# Patient Record
Sex: Female | Born: 1970
Health system: Southern US, Community
[De-identification: ages and names within clinical notes are randomized; demographics above are authoritative.]

## PROBLEM LIST (undated history)

## (undated) DIAGNOSIS — I10 Essential (primary) hypertension: Secondary | ICD-10-CM

## (undated) DIAGNOSIS — D649 Anemia, unspecified: Secondary | ICD-10-CM

## (undated) HISTORY — PX: FRACTURE SURGERY: SHX138

## (undated) HISTORY — DX: Anemia, unspecified: D64.9

## (undated) HISTORY — DX: Essential (primary) hypertension: I10

---

## 2007-09-16 ENCOUNTER — Emergency Department (HOSPITAL_COMMUNITY): Admission: EM | Admit: 2007-09-16 | Discharge: 2007-09-16 | Payer: Self-pay | Admitting: Emergency Medicine

## 2007-09-20 ENCOUNTER — Ambulatory Visit (HOSPITAL_BASED_OUTPATIENT_CLINIC_OR_DEPARTMENT_OTHER): Admission: RE | Admit: 2007-09-20 | Discharge: 2007-09-21 | Payer: Self-pay | Admitting: Orthopedic Surgery

## 2009-10-10 ENCOUNTER — Ambulatory Visit: Payer: Self-pay | Admitting: Diagnostic Radiology

## 2009-11-16 ENCOUNTER — Emergency Department (HOSPITAL_BASED_OUTPATIENT_CLINIC_OR_DEPARTMENT_OTHER): Admission: EM | Admit: 2009-11-16 | Discharge: 2009-11-16 | Payer: Self-pay | Admitting: Emergency Medicine

## 2009-11-16 ENCOUNTER — Ambulatory Visit: Payer: Self-pay | Admitting: Diagnostic Radiology

## 2010-03-12 ENCOUNTER — Emergency Department (HOSPITAL_BASED_OUTPATIENT_CLINIC_OR_DEPARTMENT_OTHER): Admission: EM | Admit: 2010-03-12 | Discharge: 2009-10-10 | Payer: Self-pay | Admitting: Emergency Medicine

## 2010-06-19 LAB — BASIC METABOLIC PANEL
BUN: 9 mg/dL (ref 6–23)
Calcium: 9.6 mg/dL (ref 8.4–10.5)
Creatinine, Ser: 0.9 mg/dL (ref 0.4–1.2)
GFR calc Af Amer: >60 mL/min (ref >60)
GFR calc non Af Amer: >60 mL/min (ref >60)
Glucose, Bld: 82 mg/dL (ref 70–99)
Potassium: 3.7 mEq/L (ref 3.5–5.1)
Sodium: 141 mEq/L (ref 135–145)

## 2010-06-19 LAB — DIFFERENTIAL
Basophils Absolute: 0.2 10*3/uL — ABNORMAL HIGH (ref 0.0–0.1)
Basophils Relative: 2 % — ABNORMAL HIGH (ref 0–1)
Eosinophils Absolute: 0.3 10*3/uL (ref 0.0–0.7)
Lymphocytes Relative: 28 % (ref 12–46)
Monocytes Absolute: 0.7 10*3/uL (ref 0.1–1.0)
Neutrophils Relative %: 59 % (ref 43–77)

## 2010-06-19 LAB — CBC
HCT: 35.8 % — ABNORMAL LOW (ref 36.0–46.0)
MCHC: 33.4 g/dL (ref 30.0–36.0)
MCV: 92.5 fL (ref 78.0–100.0)
Platelets: 395 10*3/uL (ref 150–400)

## 2010-06-19 LAB — POCT CARDIAC MARKERS
Myoglobin, poc: 94.6 ng/mL (ref 12–200)
Troponin i, poc: <0.05 ng/mL (ref 0.00–0.09)

## 2010-06-19 LAB — D-DIMER, QUANTITATIVE: D-Dimer, Quant: <0.22 ug/mL-FEU (ref 0.00–0.48)

## 2010-08-18 NOTE — Op Note (Signed)
NAME:  Julie Bowers, Julie Bowers              ACCOUNT NO.:  [id number]   MEDICAL RECORD NO.:  [id number]          PATIENT TYPE:  AMB   LOCATION:  DSC                          FACILITY:  [facility]   PHYSICIAN:  Leonides Grills, M.D.     DATE OF BIRTH:  [birth year]   DATE OF PROCEDURE:  DATE OF DISCHARGE:  09/21/2007                               OPERATIVE REPORT   PREOPERATIVE DIAGNOSES:  1. Right intraarticular calcaneus fracture.  2. Right navicular fracture.   POSTOPERATIVE DIAGNOSES:  1. Right intraarticular calcaneus fracture.  2. Right navicular fracture.   OPERATION:  1. Open reduction and fixation, right calcaneus fracture.  2. Open reduction and fixation, right navicular fracture.  3. Stress x-rays, right foot.  4. Right sural nerve neurolysis.  5. Right partial excision of calcaneus.  6. Partial excision, right navicular.   ANESTHESIA:  General.   SURGEON:  Leonides Grills, M.D.   ASSISTANT:  Richardean Canal, PAC.   ESTIMATED BLOOD LOSS:  Minimal.   TOURNIQUET TIME:  An hour and a half.   COMPLICATIONS:  None.   DISPOSITION:  Stable to PR.   INDICATIONS:  This is a 40 year old female who sustained the above  injury.  She has consented to the above procedure.  All risks including  infection, vessel injury, nonunion, malunion, hardware irritation,  hardware failure, stiffness, arthritis, persistent pain, worsening pain,  and prolonged recovery were all explained, questions were encouraged and  answered.   OPERATION:  The patient was brought to the operating room and placed in  the supine position.  After adequate general endotracheal tube  anesthesia was administered as well as Ancef 1 g IV piggyback, a bump  placed in the right ipsilateral hip and the right lower extremity and  prepped and draped in sterile manner over proximally thigh tourniquet,  limbus gravity exsanguinated.  Tourniquet elevated to 90 mmHg.  A  longitudinal incision right post tibial tendon navicular region  was then  made.  Dissection was carried down through the skin.  Hemostasis was  obtained. __________  just superior to the posterior tibial tendon was  then developed into the fracture site itself.  There was a large amount  of comminution this area and multiple fragments were removed, i.e.,  partial excision of the navicular.  Once these fragments were removed,  this was placed on the back table, and we then were able to preserve a  piece that was medially based off the navicular to the post tibial  tendon.  We then advanced this into the main body of the navicular and  then affixed this with a two-point reduction clamp, this fit  beautifully.  We then provisionally fixed this with a K-wire and then  fixed it permanently with a 2.7-mm fully threaded cortical set screw  using a 2.0-mm hole respectively.  This had excellent purchase and  maintenance of the correction.  K-wire was removed.  Stress x-rays were  then obtained and 3 views of the foot showed no gross motion fixation,  proprioception, and excellent alignment as well.  The area was copiously  irrigated with normal saline.  Subcu was closed with 3-0 Vicryl.  Skin  was closed 4-0 nylon.  We then made a longitudinal incision over the  lateral aspect of the right calcaneal neck anterior process, calcaneal  cuboid joint region.  Dissection was carried down through skin.  Hemostasis was obtained.  A formal sural neurolysis was then performed.  This was then retracted out of harm's way.  The peroneus longus and  peroneus brevis tendons were then carefully dissected out isolating and  a tenolysis was performed, and these were then retracted out of harm's  way.  Once this was done, we then identified the fracture.  The anterior  process of the calcaneus was highly comminuted that was irreparable.  This was then resected with a rongeur and sharply with the scalpel,  i.e., partial excision calcaneus.  Once this was done, the area was   copiously irrigated with normal saline, and we entered to CC joint.  Again, this was a joint depression-type fracture involving the  calcaneus, and it was highly comminuted.  We then osteotomized just  proximal to this about a centimeter and wedged this open to anatomically  reduce the fracture.  Once this was held in the anatomic position, we  then chose a DePuy titanium mini frag locking plate and applied this to  the lateral wall of the calcaneus.  We then sequentially placed 2-0  locking screws through the plate, which were 4 distally and held the  fracture anatomically reduced.  When we arranged the foot it held it  reduced as well.  The three more proximal screws were filled with  nonlocking 2-0 screws.  Again, this held the fracture anatomically.  We  then obtained stress x-rays and 3 views that showed no gross motion  fixation, proprioception, and excellent alignment as well.  The areas  were copiously irrigated with normal saline.  Tourniquet was  desufflated.  Hemostasis was obtained.  Subcu was closed with 3-0  Vicryl.  Skin was closed 4-0 nylon.  Sterile dressing was applied.  Modified Jones dressing was applied with ankle and knee in dorsiflexion.  The patient was stable to the PR.      Leonides Grills, M.D.  Electronically Signed     PB/MEDQ  D:  09/20/2007  T:  09/21/2007  Job:  [id number]

## 2010-12-31 LAB — POCT HEMOGLOBIN-HEMACUE: Hemoglobin: 13.5

## 2011-10-16 ENCOUNTER — Emergency Department (HOSPITAL_BASED_OUTPATIENT_CLINIC_OR_DEPARTMENT_OTHER)
Admission: EM | Admit: 2011-10-16 | Discharge: 2011-10-16 | Disposition: A | Discharge status: Home / Self Care | Payer: BC Managed Care – PPO | Attending: Emergency Medicine | Admitting: Emergency Medicine

## 2011-10-16 ENCOUNTER — Encounter (HOSPITAL_BASED_OUTPATIENT_CLINIC_OR_DEPARTMENT_OTHER): Payer: Self-pay | Admitting: *Deleted

## 2011-10-16 DIAGNOSIS — M79671 Pain in right foot: Secondary | ICD-10-CM

## 2011-10-16 DIAGNOSIS — Z87828 Personal history of other (healed) physical injury and trauma: Secondary | ICD-10-CM | POA: Insufficient documentation

## 2011-10-16 DIAGNOSIS — F17200 Nicotine dependence, unspecified, uncomplicated: Secondary | ICD-10-CM | POA: Insufficient documentation

## 2011-10-16 DIAGNOSIS — M79609 Pain in unspecified limb: Secondary | ICD-10-CM | POA: Insufficient documentation

## 2011-10-16 DIAGNOSIS — L84 Corns and callosities: Secondary | ICD-10-CM | POA: Insufficient documentation

## 2011-10-16 MED ORDER — HYDROCODONE-ACETAMINOPHEN 5-325 MG PO TABS
2.0000 | ORAL_TABLET | Freq: Once | ORAL | Status: AC
  Administered 2011-10-16: 2 via ORAL
  Filled 2011-10-16: qty 2

## 2011-10-16 MED ORDER — HYDROCODONE-ACETAMINOPHEN 7.5-325 MG PO TABS: 1.0000 | ORAL_TABLET | ORAL | Status: AC | PRN

## 2011-10-16 MED ORDER — KETOROLAC TROMETHAMINE 10 MG PO TABS
10.0000 mg | ORAL_TABLET | Freq: Once | ORAL | Status: AC
  Administered 2011-10-16: 10 mg via ORAL
  Filled 2011-10-16: qty 1

## 2011-10-16 MED ORDER — PROMETHAZINE HCL 25 MG PO TABS
12.5000 mg | ORAL_TABLET | Freq: Once | ORAL | Status: AC
  Administered 2011-10-16: 12.5 mg via ORAL
  Filled 2011-10-16: qty 1

## 2011-10-16 MED ORDER — MELOXICAM 7.5 MG PO TABS: ORAL_TABLET | ORAL | Status: DC

## 2011-10-16 NOTE — ED Notes (Signed)
Pt states she has had right heel pain and pain to the balls of her feet for several months.

## 2011-10-16 NOTE — ED Notes (Signed)
Pt presents to ED today with pain to ball of foot and heel.  Pain has been ongoing for several months.  No obvious deformity or injury noted

## 2011-10-16 NOTE — ED Provider Notes (Signed)
Medical screening examination/treatment/procedure(s) were performed by non-physician practitioner and as supervising physician I was immediately available for consultation/collaboration.  Gerhard Munch, MD 10/16/11 2318

## 2011-10-16 NOTE — ED Provider Notes (Signed)
History     CSN: [id number]  Arrival date & time 10/16/11  2151   First MD Initiated Contact with Patient 10/16/11 2211      Chief Complaint  Patient presents with  . Foot Pain    (Consider location/radiation/quality/duration/timing/severity/associated sxs/prior treatment) Patient is a 41 y.o. female presenting with lower extremity pain. The history is provided by the patient.  Foot Pain This is a chronic problem. The current episode started more than 1 month ago. The problem occurs daily. The problem has been gradually worsening. Associated symptoms include arthralgias. Pertinent negatives include no abdominal pain, chest pain, coughing or neck pain. Nothing aggravates the symptoms. She has tried nothing for the symptoms. The treatment provided no relief.    History reviewed. No pertinent past medical history.  Past Surgical History  Procedure Date  . Fracture surgery     History reviewed. No pertinent family history.  History  Substance Use Topics  . Smoking status: Current Everyday Smoker  . Smokeless tobacco: Not on file  . Alcohol Use: Yes    OB History    Grav Para Term Preterm Abortions TAB SAB Ect Mult Living                  Review of Systems  Constitutional: Negative for activity change.       All ROS Neg except as noted in HPI  HENT: Negative for nosebleeds and neck pain.   Eyes: Negative for photophobia and discharge.  Respiratory: Negative for cough, shortness of breath and wheezing.   Cardiovascular: Negative for chest pain and palpitations.  Gastrointestinal: Negative for abdominal pain and blood in stool.  Genitourinary: Negative for dysuria, frequency and hematuria.  Musculoskeletal: Positive for arthralgias. Negative for back pain.  Skin: Negative.   Neurological: Negative for dizziness, seizures and speech difficulty.  Psychiatric/Behavioral: Negative for hallucinations and confusion.    Allergies  Review of patient's allergies indicates no  known allergies.  Home Medications   Current Outpatient Rx  Name Route Sig Dispense Refill  . BIOTIN PO Oral Take 1 tablet by mouth daily.    . IBUPROFEN 200 MG PO TABS Oral Take 600 mg by mouth every 6 (six) hours as needed. For pain    . ADULT MULTIVITAMIN W/MINERALS CH Oral Take 1 tablet by mouth daily.      BP 168/75  Pulse 86  Temp 97.7 F (36.5 C) (Oral)  Resp 20  Ht 5\' 9"  (1.753 m)  Wt 214 lb (97.07 kg)  BMI 31.60 kg/m2  SpO2 100%  LMP 09/25/2011  Physical Exam  Nursing note and vitals reviewed. Constitutional: She is oriented to person, place, and time. She appears well-developed and well-nourished.  Non-toxic appearance.  HENT:  Head: Normocephalic.  Right Ear: Tympanic membrane and external ear normal.  Left Ear: Tympanic membrane and external ear normal.  Eyes: EOM and lids are normal. Pupils are equal, round, and reactive to light.  Neck: Normal range of motion. Neck supple. Carotid bruit is not present.  Cardiovascular: Normal rate, regular rhythm, normal heart sounds, intact distal pulses and normal pulses.   Pulmonary/Chest: Breath sounds normal. No respiratory distress.  Abdominal: Soft. Bowel sounds are normal. There is no tenderness. There is no guarding.  Musculoskeletal: Normal range of motion.       There is increased callus formation and of the first third and fifth metatarsal heads on the right and the left. There is a tenderness to palpation and manipulation of the right heel area.  The Achilles tendon are intact bilaterally. There no lesions between the toes. Is full range of motion of the toes and the ankles. The dorsalis pedis and posterior tibial pulses are 2+ bilaterally.  Lymphadenopathy:       Head (right side): No submandibular adenopathy present.       Head (left side): No submandibular adenopathy present.    She has no cervical adenopathy.  Neurological: She is alert and oriented to person, place, and time. She has normal strength. No cranial  nerve deficit or sensory deficit.  Skin: Skin is warm and dry.  Psychiatric: She has a normal mood and affect. Her speech is normal.    ED Course  Procedures (including critical care time)  Labs Reviewed - No data to display No results found.   No diagnosis found.    MDM  I have reviewed nursing notes, vital signs, and all appropriate lab and imaging results for this patient. Patient sustained an injury to the right foot several years ago. She has since that time been developing more and more pain in the right foot and now in the left foot. 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 Her feet are giving her more and more problems.  Discussed plan with the patient. Plan at this time is for the patient to see podiatry for evaluation and management. Prescription for Mobic 7.5 mg and Norco 7.5 mg given to the patient.       Kathie Dike, [place] 10/16/11 2312

## 2016-12-08 ENCOUNTER — Encounter (HOSPITAL_COMMUNITY): Payer: Self-pay | Admitting: Emergency Medicine

## 2016-12-08 ENCOUNTER — Emergency Department (HOSPITAL_COMMUNITY)
Admission: EM | Admit: 2016-12-08 | Discharge: 2016-12-08 | Disposition: A | Discharge status: Home / Self Care | Payer: Self-pay | Attending: Emergency Medicine | Admitting: Emergency Medicine

## 2016-12-08 DIAGNOSIS — F17210 Nicotine dependence, cigarettes, uncomplicated: Secondary | ICD-10-CM | POA: Insufficient documentation

## 2016-12-08 DIAGNOSIS — M62830 Muscle spasm of back: Secondary | ICD-10-CM | POA: Insufficient documentation

## 2016-12-08 DIAGNOSIS — Z79899 Other long term (current) drug therapy: Secondary | ICD-10-CM | POA: Insufficient documentation

## 2016-12-08 MED ORDER — CYCLOBENZAPRINE HCL 10 MG PO TABS: 10.0000 mg | ORAL_TABLET | Freq: Two times a day (BID) | ORAL | 0 refills | Status: DC | PRN

## 2016-12-08 MED ORDER — LIDOCAINE 5 % EX PTCH: 1.0000 | MEDICATED_PATCH | CUTANEOUS | 0 refills | Status: DC

## 2016-12-08 MED ORDER — IBUPROFEN 800 MG PO TABS: 800.0000 mg | ORAL_TABLET | Freq: Three times a day (TID) | ORAL | 0 refills | Status: DC

## 2016-12-08 MED ORDER — KETOROLAC TROMETHAMINE 60 MG/2ML IM SOLN
30.0000 mg | Freq: Once | INTRAMUSCULAR | Status: AC
  Administered 2016-12-08: 30 mg via INTRAMUSCULAR
  Filled 2016-12-08: qty 2

## 2016-12-08 NOTE — Discharge Instructions (Signed)
Medications as prescribed.

## 2016-12-08 NOTE — ED Provider Notes (Signed)
Patient seen and evaluated. Discussed with resident, and evaluated with resident. Patient has reproducible pain right paraspinal musculature. Clear lungs. No GU or UTI symptoms. Tender to palpate. Pain is rotation and flexion. No extremity symptoms. No rash or vesicles to suggest zoster. One similar episode in the past responded well to Toradol, anti-inflammatories, and muscle relaxants. Given Toradol 30 IM. Anti-inflammatory muscle relaxer prescription. Work note.   Rolland PorterJames, Morgen Linebaugh, MD 12/08/16 [phone]

## 2016-12-08 NOTE — ED Provider Notes (Signed)
[facility] Provider Note   CSN: [id number] Arrival date & time: 12/08/16  [id number]  History   Chief Complaint Chief Complaint  Patient presents with  . Back Pain    HPI Julie Bowers is a 46 y.o. female.  Patient presented with acute onset severe pain in the middle of back, mainly on the right side of her mid-back. She states she was in her usual state of health until she went to sleep the night before and all of the sudden she woke up with pain in her back. The pain is rated as a 10/10 and described as a sharp stabbing pain. It does not radiate to her chest or leg. She has not had any loss of bladder bowel function, pain with urination, history of kidney stones, or difficulty breathing. She tried a heating pad and taking naproxen for her pain, but neither of these interventions gave her relief. She works an Designer, television/film setindustrial machine and states that her pain in her back occurs with repetitive movements. She could not go to work today.     History reviewed. No pertinent past medical history.  There are no active problems to display for this patient.   Past Surgical History:  Procedure Laterality Date  . FRACTURE SURGERY      OB History    No data available      Home Medications    Prior to Admission medications   Medication Sig Start Date End Date Taking? Authorizing Provider  BIOTIN PO Take 1 tablet by mouth daily.    [provider]  cyclobenzaprine (FLEXERIL) 10 MG tablet Take 1 tablet (10 mg total) by mouth 2 (two) times daily as needed for muscle spasms. 12/08/16   Rolland PorterJames, Mark, MD  ibuprofen (ADVIL,MOTRIN) 800 MG tablet Take 1 tablet (800 mg total) by mouth 3 (three) times daily. 12/08/16   Rolland PorterJames, Mark, MD  lidocaine (LIDODERM) 5 % Place 1 patch onto the skin daily. Remove & Discard patch within 12 hours or as directed by MD 12/08/16   Rolland PorterJames, Mark, MD  meloxicam [facility](MOBIC) 7.5 MG tablet 1 po bid with food 10/16/11   Ivery QualeBryant, [place], PA-C  Multiple Vitamin (MULTIVITAMIN  WITH MINERALS) TABS Take 1 tablet by mouth daily.    [provider]   Family History No family history on file.  Social History Social History  Substance Use Topics  . Smoking status: Current Every Day Smoker    Types: Cigarettes  . Smokeless tobacco: Never Used     Comment: 1 pack per week   . Alcohol use Yes     Comment: occasional     Allergies   Patient has no known allergies.   Review of Systems Review of Systems  Constitutional: Negative for fatigue, fever and unexpected weight change.  HENT: Negative for ear pain, sinus pain and sinus pressure.   Respiratory: Negative for chest tightness.   Cardiovascular: Negative for chest pain and leg swelling.  Gastrointestinal: Negative for abdominal pain, diarrhea, nausea and vomiting.  Genitourinary: Negative for difficulty urinating, dysuria, flank pain and hematuria.  Musculoskeletal: Positive for back pain. Negative for gait problem, neck pain and neck stiffness.  Neurological: Negative for dizziness, tremors, facial asymmetry, weakness, numbness and headaches.     Physical Exam Updated Vital Signs BP (!) 144/63   Pulse 62   Temp 98.1 F (36.7 C) (Oral)   Resp 16   Ht 5\' 9"  (1.753 m)   Wt 103 kg (227 lb)   LMP 12/08/2016  SpO2 100%   BMI 33.52 kg/m   Physical Exam  Constitutional: She appears well-developed and well-nourished. No distress.  Patient sitting uncomfortably in bed  HENT:  Mouth/Throat: Oropharynx is clear and moist. No oropharyngeal exudate.  Cardiovascular: Normal rate, regular rhythm and normal heart sounds.  Exam reveals no friction rub.   No murmur heard. Pulmonary/Chest: Effort normal. No respiratory distress. She has no wheezes.  Abdominal: She exhibits no distension. There is no tenderness. There is no guarding.  Musculoskeletal: She exhibits no edema (of bilateral lower extremities) or tenderness (of bilateral lower extremities).  Pain with palpation of lateral R throacic spine.  No over deformities or point tenderness with palpation of spine. No CVA tenderness.   Lymphadenopathy:    She has no cervical adenopathy.  Neurological:  EOM intact. Face symmetric. Tongue midline. 5/5 bilateral strength of LE and UE. Sensation to light touch intact bilaterally. Normal reflexes of LE.   Skin: Skin is warm and dry. Capillary refill takes less than 2 seconds. No rash noted. She is not diaphoretic. No erythema.    ED Treatments / Results  Labs (all labs ordered are listed, but only abnormal results are displayed) Labs Reviewed - No data to display  EKG  EKG Interpretation None       Radiology No results found.  Procedures Procedures (including critical care time)  Medications Ordered in ED Medications  ketorolac (TORADOL) injection 30 mg (30 mg Intramuscular Given 12/08/16 0906)     Initial Impression / Assessment and Plan / ED Course  I have reviewed the triage vital signs and the nursing notes.  Pertinent labs & imaging results that were available during my care of the patient were reviewed by me and considered in my medical decision making (see chart for details).  The patient does not have any red flag symptoms, including loss of continence, point tenderness on exam, LE strength 5/5 bilaterally without change in sensation/reflexes, or radiation of pain down her legs. Her pain is mainly located in the paraspinous region of her R thoracic spine and is similar to when she has pulled muscles in the past. I think this is a muscle spasm or strain, given the patient's job that requires repetive movements and heavy lifting.   She was tearful during exam and states she responded well to Toradol injections in the past. Will give Toradol for pain management, prescribed 800 mg ibuprofen TID, and give muscle relaxer for management.    Final Clinical Impressions(s) / ED Diagnoses   Final diagnoses:  Muscle spasm of back   New Prescriptions Discharge Medication List as  of 12/08/2016  9:25 AM    START taking these medications   Details  cyclobenzaprine (FLEXERIL) 10 MG tablet Take 1 tablet (10 mg total) by mouth 2 (two) times daily as needed for muscle spasms., Starting Wed 12/08/2016, Print    lidocaine (LIDODERM) 5 % Place 1 patch onto the skin daily. Remove & Discard patch within 12 hours or as directed by MD, Starting Wed 12/08/2016, Print         Rozann Lesches, MD 12/08/16 4098    Rolland Porter, MD 12/19/16 1501

## 2016-12-08 NOTE — ED Triage Notes (Signed)
Patient reports lower back pain that began last night when she was laying down. Patient reports history of the same. No known injury. Patient tried naproxen with no relief at home. Patient ambulatory but reports pain.

## 2017-11-08 ENCOUNTER — Ambulatory Visit: Payer: Self-pay

## 2017-11-08 ENCOUNTER — Other Ambulatory Visit: Payer: Self-pay | Admitting: Occupational Medicine

## 2017-11-08 DIAGNOSIS — M79641 Pain in right hand: Secondary | ICD-10-CM

## 2017-11-08 DIAGNOSIS — M25531 Pain in right wrist: Secondary | ICD-10-CM

## 2019-09-28 DIAGNOSIS — N920 Excessive and frequent menstruation with regular cycle: Secondary | ICD-10-CM | POA: Insufficient documentation

## 2019-09-28 HISTORY — DX: Excessive and frequent menstruation with regular cycle: N92.0

## 2019-10-23 ENCOUNTER — Other Ambulatory Visit: Payer: Self-pay

## 2019-10-23 ENCOUNTER — Encounter (HOSPITAL_COMMUNITY): Payer: Self-pay

## 2019-10-23 ENCOUNTER — Ambulatory Visit (HOSPITAL_COMMUNITY)
Admission: EM | Admit: 2019-10-23 | Discharge: 2019-10-23 | Disposition: A | Discharge status: Home / Self Care | Payer: BC Managed Care – PPO | Attending: Family Medicine | Admitting: Family Medicine

## 2019-10-23 DIAGNOSIS — M25461 Effusion, right knee: Secondary | ICD-10-CM | POA: Diagnosis not present

## 2019-10-23 DIAGNOSIS — M25561 Pain in right knee: Secondary | ICD-10-CM

## 2019-10-23 MED ORDER — KETOROLAC TROMETHAMINE 30 MG/ML IJ SOLN
INTRAMUSCULAR | Status: AC
  Filled 2019-10-23: qty 1

## 2019-10-23 MED ORDER — KETOROLAC TROMETHAMINE 30 MG/ML IJ SOLN
30.0000 mg | Freq: Once | INTRAMUSCULAR | Status: AC
  Administered 2019-10-23: 30 mg via INTRAMUSCULAR

## 2019-10-23 MED ORDER — PREDNISONE 10 MG (21) PO TBPK: ORAL_TABLET | ORAL | 0 refills | Status: DC

## 2019-10-23 NOTE — ED Provider Notes (Signed)
[facility]    CSN: [id number] Arrival date & time: 10/23/19  1252      History   Chief Complaint Chief Complaint  Patient presents with  . Knee Pain    HPI Julie Bowers is a 49 y.o. female.   Pt is a 49 year old female with history of injury to right knee 30 years ago. Reports worsening right knee pain and swelling x 2 days. Denies recent/new injury to right knee. Job requires ambulating and standing most of the day. Pain worse while ambulating. Not alleviated by topical analgesic. Denies previous hx of fracture or dislocation of knee.  ROS per HPI      History reviewed. No pertinent past medical history.  There are no problems to display for this patient.   Past Surgical History:  Procedure Laterality Date  . FRACTURE SURGERY      OB History   No obstetric history on file.      Home Medications    Prior to Admission medications   Medication Sig Start Date End Date Taking? Authorizing Provider  Multiple Vitamin (MULTIVITAMIN WITH MINERALS) TABS Take 1 tablet by mouth daily.    [provider]  predniSONE (STERAPRED UNI-PAK 21 TAB) 10 MG (21) TBPK tablet 6 tabs for 1 day, then 5 tabs for 1 das, then 4 tabs for 1 day, then 3 tabs for 1 day, 2 tabs for 1 day, then 1 tab for 1 day 10/23/19   Janace Aris, NP    Family History History reviewed. No pertinent family history.  Social History Social History   Tobacco Use  . Smoking status: Former Smoker    Types: Cigarettes  . Smokeless tobacco: Never Used  . Tobacco comment: 1 pack per week   Vaping Use  . Vaping Use: Never used  Substance Use Topics  . Alcohol use: Yes    Comment: occasional   . Drug use: No     Allergies   Patient has no known allergies.   Review of Systems Review of Systems  Constitutional: Negative.   Musculoskeletal: Positive for arthralgias and joint swelling.  Neurological: Negative.      Physical Exam Triage Vital Signs ED Triage Vitals    Enc Vitals Group     BP 10/23/19 1350 (!) 166/72     Pulse Rate 10/23/19 1350 82     Resp 10/23/19 1350 18     Temp 10/23/19 1350 98.4 F (36.9 C)     Temp Source 10/23/19 1350 Oral     SpO2 10/23/19 1350 100 %     Weight 10/23/19 1351 243 lb (110.2 kg)     Height 10/23/19 1351 5\' 9"  (1.753 m)     Head Circumference --      Peak Flow --      Pain Score 10/23/19 1350 10     Pain Loc --      Pain Edu? --      Excl. in GC? --    No data found.  Updated Vital Signs BP (!) 166/72   Pulse 82   Temp 98.4 F (36.9 C) (Oral)   Resp 18   Ht 5\' 9"  (1.753 m)   Wt 243 lb (110.2 kg)   SpO2 100%   BMI 35.88 kg/m   Visual Acuity Right Eye Distance:   Left Eye Distance:   Bilateral Distance:    Right Eye Near:   Left Eye Near:    Bilateral Near:     Physical  Exam Cardiovascular:     Pulses:          Dorsalis pedis pulses are 2+ on the right side.       Posterior tibial pulses are 2+ on the right side.  Musculoskeletal:     Right knee: Swelling present. No erythema, ecchymosis, bony tenderness or crepitus. Decreased range of motion. Tenderness present over the medial joint line and lateral joint line. Normal pulse.       Legs:      UC Treatments / Results  Labs (all labs ordered are listed, but only abnormal results are displayed) Labs Reviewed - No data to display  EKG   Radiology No results found.  Procedures Procedures (including critical care time)  Medications Ordered in UC Medications  ketorolac (TORADOL) 30 MG/ML injection 30 mg (30 mg Intramuscular Given 10/23/19 1428)    Initial Impression / Assessment and Plan / UC Course  I have reviewed the triage vital signs and the nursing notes.  Pertinent labs & imaging results that were available during my care of the patient were reviewed by me and considered in my medical decision making (see chart for details).     Right knee pain with effusion Most likely arthritis We will have her rest, ice,  elevate. Ace wrap applied here in clinic. Help with swelling and compression. Prednisone taper over the next 6 days. Referral put in for sports medicine for follow-up Final Clinical Impressions(s) / UC Diagnoses   Final diagnoses:  Knee effusion, right  Acute pain of right knee     Discharge Instructions     Take the medication as prescribed. Rest, ice the knee. Ace wrap applied here in clinic. Use this to help with the swelling and compression. I have put a referral in for sports medicine for follow-up they should be calling you to set up an appointment    ED Prescriptions    Medication Sig Dispense Auth. Provider   predniSONE (STERAPRED UNI-PAK 21 TAB) 10 MG (21) TBPK tablet 6 tabs for 1 day, then 5 tabs for 1 das, then 4 tabs for 1 day, then 3 tabs for 1 day, 2 tabs for 1 day, then 1 tab for 1 day 21 tablet Lauralie Blacksher A, NP     PDMP not reviewed this encounter.   Dahlia Byes A, NP 10/24/19 1053

## 2019-10-23 NOTE — Discharge Instructions (Signed)
Take the medication as prescribed. Rest, ice the knee. Ace wrap applied here in clinic. Use this to help with the swelling and compression. I have put a referral in for sports medicine for follow-up they should be calling you to set up an appointment

## 2019-10-23 NOTE — ED Notes (Signed)
Ace applied to right knee.
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
## 2019-11-13 ENCOUNTER — Other Ambulatory Visit: Payer: BC Managed Care – PPO

## 2019-11-13 ENCOUNTER — Other Ambulatory Visit: Payer: Self-pay

## 2019-11-13 DIAGNOSIS — Z20822 Contact with and (suspected) exposure to covid-19: Secondary | ICD-10-CM

## 2019-11-14 LAB — NOVEL CORONAVIRUS, NAA: SARS-CoV-2, NAA: NOT DETECTED

## 2019-11-14 LAB — SARS-COV-2, NAA 2 DAY TAT

## 2019-11-23 ENCOUNTER — Ambulatory Visit: Payer: BC Managed Care – PPO | Admitting: Family Medicine

## 2019-11-23 ENCOUNTER — Other Ambulatory Visit: Payer: Self-pay

## 2019-11-23 VITALS — BP 124/86 | Ht 69.0 in | Wt 245.0 lb

## 2019-11-23 DIAGNOSIS — M25561 Pain in right knee: Secondary | ICD-10-CM | POA: Insufficient documentation

## 2019-11-23 HISTORY — DX: Pain in right knee: M25.561

## 2019-11-23 MED ORDER — METHYLPREDNISOLONE ACETATE 40 MG/ML IJ SUSP
40.0000 mg | Freq: Once | INTRAMUSCULAR | Status: AC
  Administered 2019-11-23: 40 mg via INTRA_ARTICULAR

## 2019-11-23 NOTE — Assessment & Plan Note (Signed)
She is not having locking or giving way.  We will try corticosteroid injection home exercise plan.  Follow-up 4 to 6 weeks.  If not improving, the next step would be MRI with potential arthroscope.  We discussed this in detail.

## 2019-11-23 NOTE — Progress Notes (Signed)
  Julie Bowers - 49 y.o. female MRN [id number]  Date of birth: 1970-05-23    SUBJECTIVE:      Chief Complaint:/ HPI:  Acute on chronic right knee pain.  Injured her knee 20 years ago.  No surgeries.  Over the last few months when she does a lot of standing at her job, she has increasing pain and swelling.  Pain is both anterior and posterior.  No locking or giving way.  She does notice a lot of cracking noises that she did not used to have.  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  Plans to transition to a different job in the near future.    OBJECTIVE: BP 124/86   Ht 5\' 9"  (1.753 m)   Wt 245 lb (111.1 kg)   BMI 36.18 kg/m   Physical Exam:  Vital signs are reviewed. GENERAL: Well-developed female, overweight, no acute distress KNEES: Right knee has very slight effusion noted in the suprapatellar pouch.  She has full range of motion in flexion extension.  Mild crepitus on extension.  Ligamentously intact to varus and valgus stress.  Lachman's is normal.  Popliteal space is benign.  Calf is benign.  McMurray negative, Thessaly positive. PROCEDURE: INJECTION: Patient was given informed consent, signed copy in the chart. Appropriate time out was taken. Area prepped and draped in usual sterile fashion. Ethyl chloride was  used for local anesthesia. A 21 gauge 1 1/2 inch needle was used.. 1 cc of methylprednisolone 40 mg/ml plus 4 cc of 1% lidocaine without epinephrine was injected into the right knee using a(n) anterior medial approach.   The patient tolerated the procedure well. There were no complications. Post procedure instructions were given.   ASSESSMENT & PLAN:  See problem based charting & AVS for pt instructions. Right knee pain She is not having locking or giving way.  We will try corticosteroid injection home exercise plan.  Follow-up 4 to 6 weeks.  If not improving, the next step  would be MRI with potential arthroscope.  We discussed this in detail.

## 2019-12-25 IMAGING — DX DG WRIST COMPLETE 3+V*R*
4 series · 4 of 4 positions shown · non-contrast
Comparison: None.

CLINICAL DATA: Chronic pain.

EXAM:
RIGHT WRIST - COMPLETE 3+ VIEW

[wrist pa]
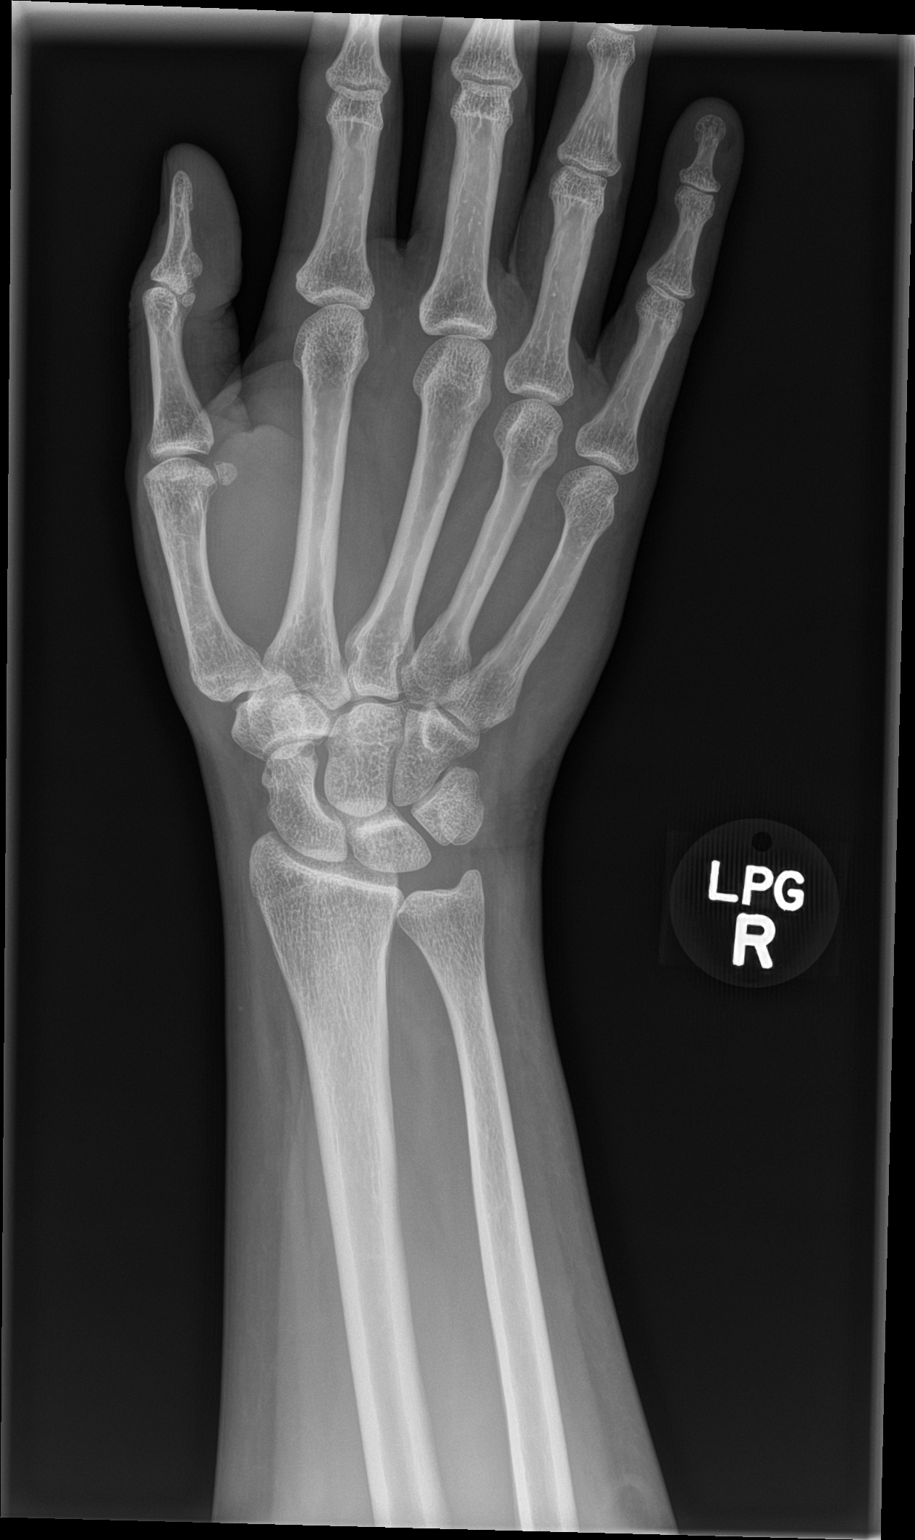

[wrist obl]
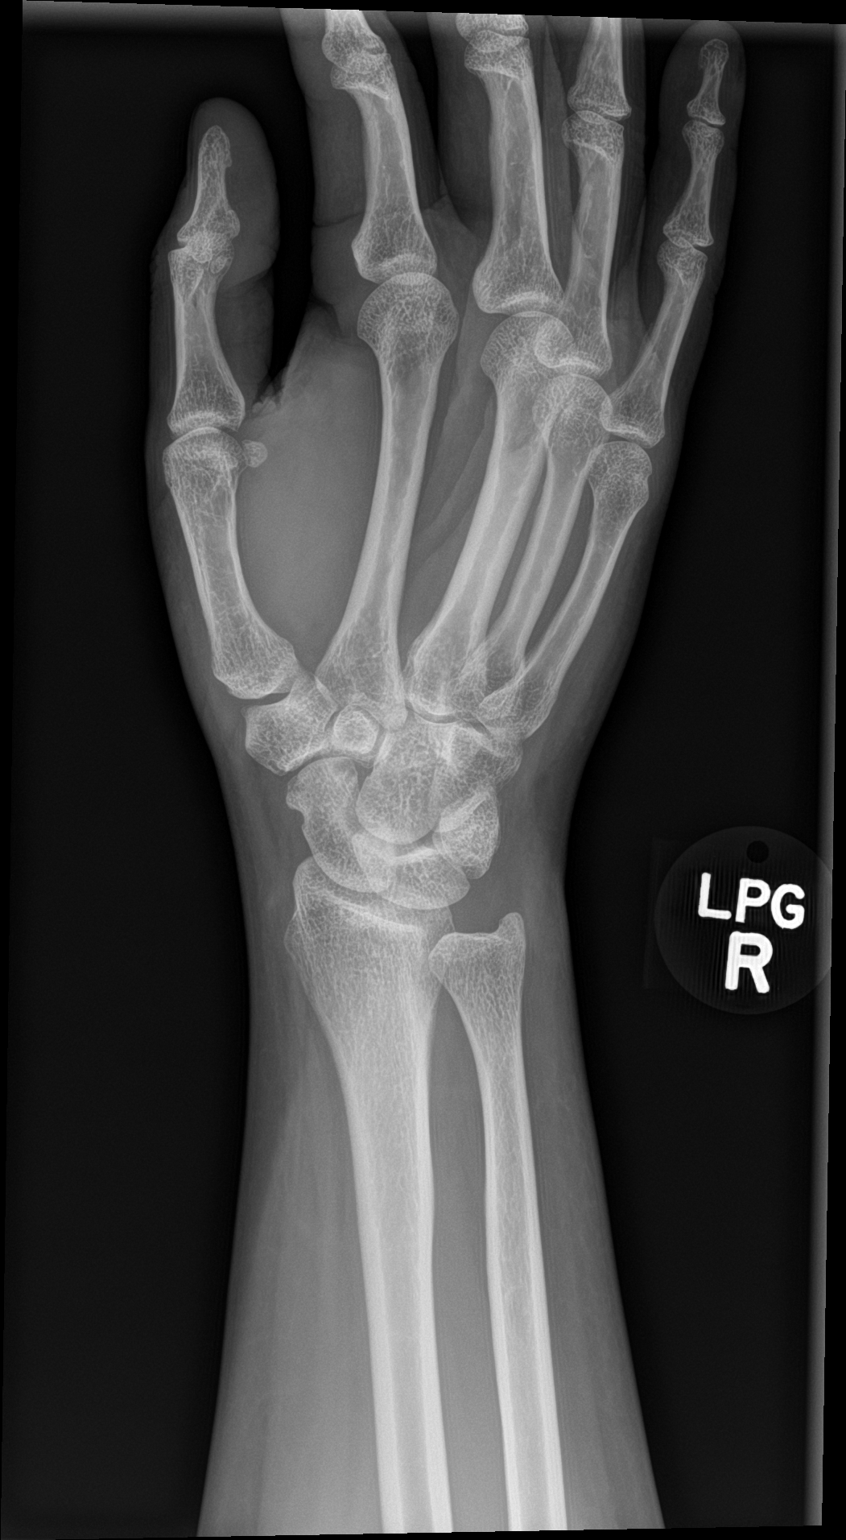

[wrist lat]
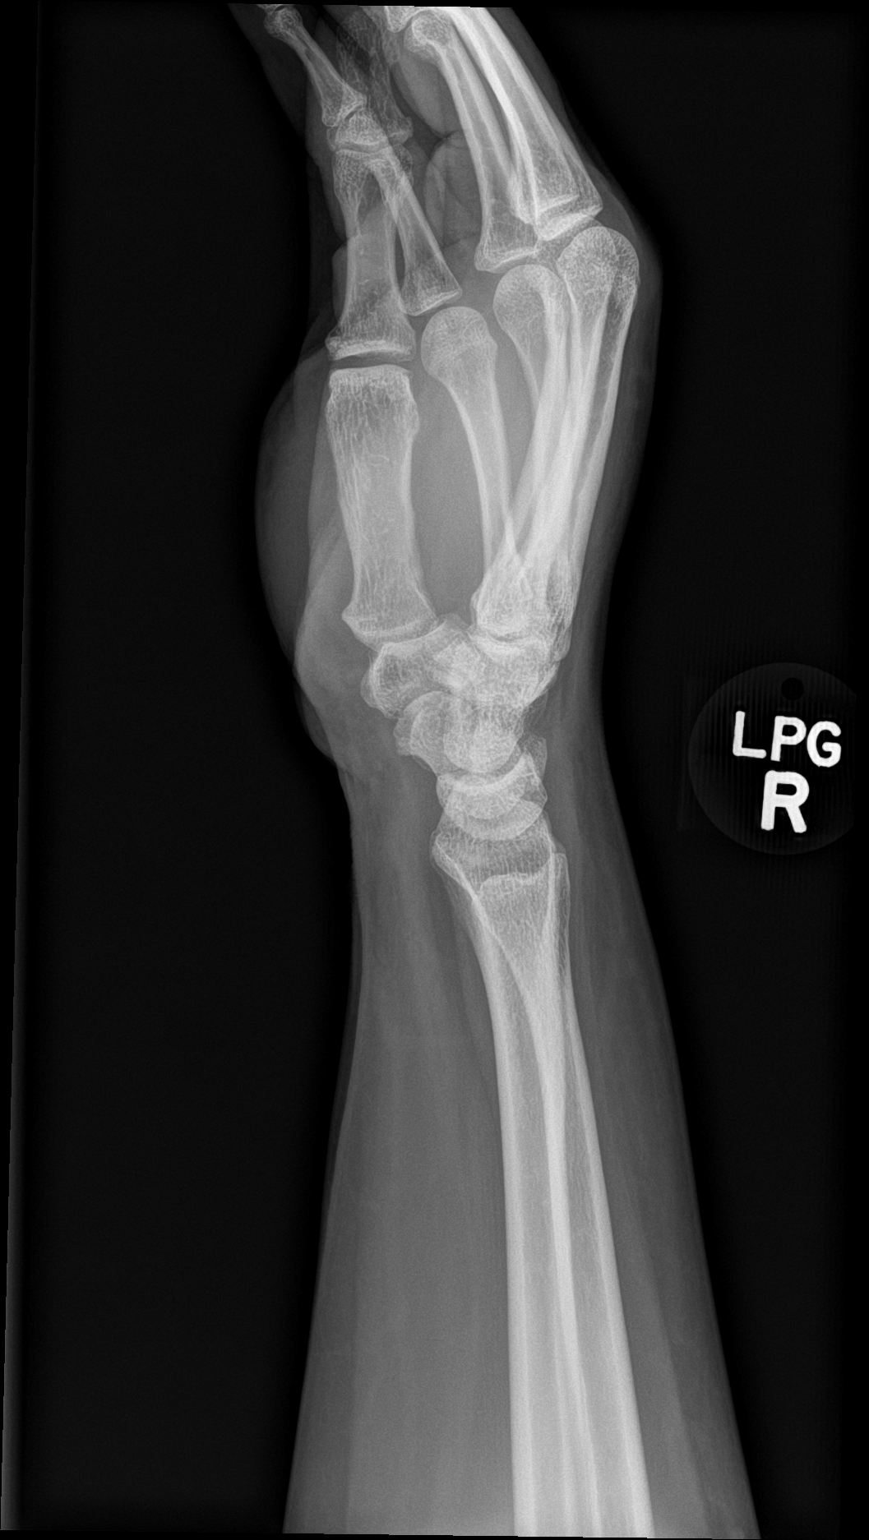

[wrist navicular]
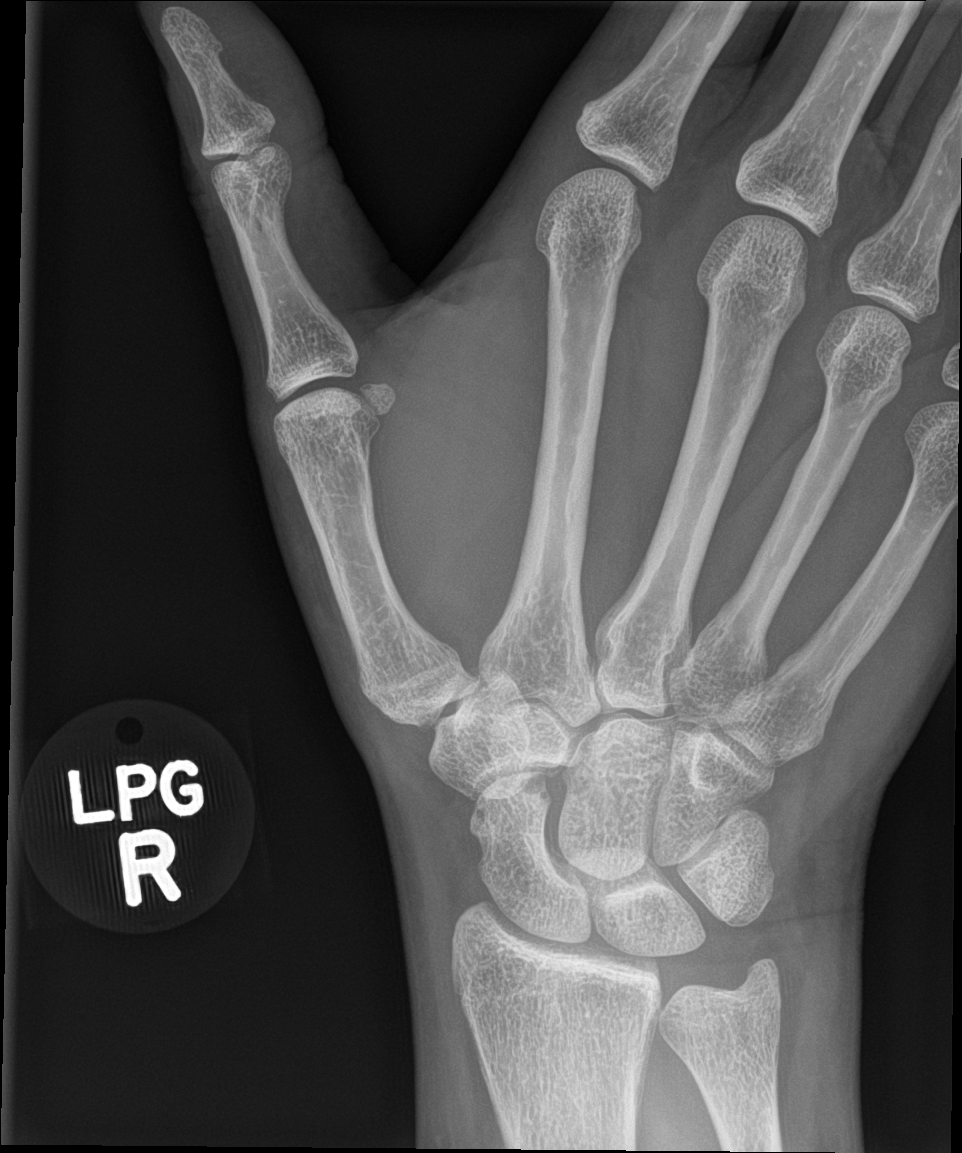

[4 of 4 positions shown; findings below may reference images not displayed]

FINDINGS: Frontal, oblique, lateral, and ulnar deviation scaphoid images were
obtained. There is no evident fracture or dislocation. Joint spaces
appear normal. No erosive change. There is a small benign cyst in
the distal scaphoid.
IMPRESSION: Small cyst in distal scaphoid bone. No fracture or dislocation. No
appreciable arthropathy.

## 2020-02-11 ENCOUNTER — Telehealth: Payer: Self-pay | Admitting: Hematology and Oncology

## 2020-02-11 NOTE — Telephone Encounter (Signed)
Received a new pt referral from Dr. Ellyn Hack for anemia. Pt has been cld and scheduled to see Dr. Leonides Schanz on 11/26 at 1pm. Pt aware to arrive 15 minutes early.

## 2020-02-29 ENCOUNTER — Encounter: Payer: Self-pay | Admitting: Hematology and Oncology

## 2020-02-29 ENCOUNTER — Other Ambulatory Visit: Payer: Self-pay

## 2020-02-29 ENCOUNTER — Inpatient Hospital Stay: Payer: BC Managed Care – PPO | Attending: Hematology and Oncology | Admitting: Hematology and Oncology

## 2020-02-29 ENCOUNTER — Other Ambulatory Visit: Payer: Self-pay | Admitting: Hematology and Oncology

## 2020-02-29 ENCOUNTER — Inpatient Hospital Stay: Payer: BC Managed Care – PPO

## 2020-02-29 VITALS — BP 145/73 | HR 86 | Temp 98.1°F | Resp 18 | Ht 69.0 in | Wt 258.1 lb

## 2020-02-29 DIAGNOSIS — D500 Iron deficiency anemia secondary to blood loss (chronic): Secondary | ICD-10-CM | POA: Diagnosis not present

## 2020-02-29 DIAGNOSIS — D509 Iron deficiency anemia, unspecified: Secondary | ICD-10-CM | POA: Insufficient documentation

## 2020-02-29 DIAGNOSIS — Z87891 Personal history of nicotine dependence: Secondary | ICD-10-CM | POA: Diagnosis not present

## 2020-02-29 DIAGNOSIS — Z791 Long term (current) use of non-steroidal anti-inflammatories (NSAID): Secondary | ICD-10-CM | POA: Insufficient documentation

## 2020-02-29 LAB — CBC WITH DIFFERENTIAL (CANCER CENTER ONLY)
Abs Immature Granulocytes: 0.06 10*3/uL (ref 0.00–0.07)
Basophils Absolute: 0.1 10*3/uL (ref 0.0–0.1)
Basophils Relative: 1 %
Eosinophils Absolute: 0.4 10*3/uL (ref 0.0–0.5)
Eosinophils Relative: 5 %
HCT: 37.9 % (ref 36.0–46.0)
Hemoglobin: 11.6 g/dL — ABNORMAL LOW (ref 12.0–15.0)
Immature Granulocytes: 1 %
Lymphocytes Relative: 30 %
Lymphs Abs: 2.6 10*3/uL (ref 0.7–4.0)
MCH: 26.1 pg (ref 26.0–34.0)
MCHC: 30.6 g/dL (ref 30.0–36.0)
MCV: 85.2 fL (ref 80.0–100.0)
Monocytes Absolute: 1 10*3/uL (ref 0.1–1.0)
Monocytes Relative: 12 %
Neutro Abs: 4.5 10*3/uL (ref 1.7–7.7)
Neutrophils Relative %: 51 %
Platelet Count: 397 10*3/uL (ref 150–400)
RBC: 4.45 MIL/uL (ref 3.87–5.11)
RDW: 15.9 % — ABNORMAL HIGH (ref 11.5–15.5)
WBC Count: 8.7 10*3/uL (ref 4.0–10.5)
nRBC: 0 % (ref 0.0–0.2)

## 2020-02-29 LAB — RETIC PANEL
Immature Retic Fract: 25.6 % — ABNORMAL HIGH (ref 2.3–15.9)
RBC.: 4.36 MIL/uL (ref 3.87–5.11)
Retic Count, Absolute: 64.1 10*3/uL (ref 19.0–186.0)
Retic Ct Pct: 1.5 % (ref 0.4–3.1)
Reticulocyte Hemoglobin: 32.1 pg (ref >27.9)

## 2020-02-29 LAB — CMP (CANCER CENTER ONLY)
ALT: 12 U/L (ref 0–44)
AST: 15 U/L (ref 15–41)
Albumin: 3.8 g/dL (ref 3.5–5.0)
Alkaline Phosphatase: 85 U/L (ref 38–126)
Anion gap: 10 (ref 5–15)
BUN: 13 mg/dL (ref 6–20)
CO2: 26 mmol/L (ref 22–32)
Calcium: 9.8 mg/dL (ref 8.9–10.3)
Chloride: 105 mmol/L (ref 98–111)
Creatinine: 0.96 mg/dL (ref 0.44–1.00)
GFR, Estimated: >60 mL/min (ref >60)
Glucose, Bld: 95 mg/dL (ref 70–99)
Potassium: 4.3 mmol/L (ref 3.5–5.1)
Sodium: 141 mmol/L (ref 135–145)
Total Bilirubin: 0.3 mg/dL (ref 0.3–1.2)
Total Protein: 7.4 g/dL (ref 6.5–8.1)

## 2020-02-29 NOTE — Progress Notes (Signed)
[facility] [phone]   [phone]  INITIAL CONSULT NOTE  Patient Care Team: Patient, No Pcp Per as PCP - General (General Practice)  Hematological/Oncological History # Anemia 1) 02/06/2020: WBC 7.6, Hgb 11.7, MCV 83, Plt 462. 2) 02/29/2020: establish care with Dr. Leonides Schanz   CHIEF COMPLAINTS/PURPOSE OF CONSULTATION:  "Anemia "  HISTORY OF PRESENTING ILLNESS:  Julie Bowers 49 y.o. female with medical history significant for hypertension who presents for evaluation of iron deficiency anemia.  On review of the previous records Julie Bowers was noted to have  WBC 7.6, Hgb 11.7, MCV 83, Plt 462 on 02/06/2020. Due to concern for this mild anemia the patient was referred to hematology for further evaluation and management.   On exam today Julie Bowers notes that she has had issues with iron deficiency anemia her whole life.  She notes that all the women in her family has had issues with anemia as well.  She reports that she has been prescribed iron pills but only takes them sparingly.  She also reports that she has done what she can to eat iron rich foods including liver and red meat.  She notes that her main source of bleeding was menstrual cycles however she underwent an ablation in June 2021.  She notes that she is not having any bleeding since that time.  She is not having any other overt sources of bleeding such as nosebleeds, gum bleeding, or dark stools.  On further discussion she reports that other than the ablation she is only had a foot surgery and no other major surgeries.  Her family history is remarkable for anemia in her mom, sister, and 2 daughters.  She also reports that her mother and brother both had stomach cancer and died from that.  She notes that she is up-to-date on her mammogram but has yet to undergo a colonoscopy.  She reports that symptomatically she is quite tired and worn out and does occasionally have some shortness of breath on  exertion.  She was previously a smoker but quit in 2015 and drinks approximately 2 glasses of wine per day.  Otherwise she denies having any fevers, chills, sweats, nausea, vomiting or diarrhea.  A full 10 point ROS is listed below.  MEDICAL HISTORY:  No past medical history on file.  SURGICAL HISTORY: Past Surgical History:  Procedure Laterality Date   FRACTURE SURGERY      SOCIAL HISTORY: Social History   Socioeconomic History   Marital status: Divorced    Spouse name: Not on file   Number of children: Not on file   Years of education: Not on file   Highest education level: Not on file  Occupational History   Not on file  Tobacco Use   Smoking status: Former Smoker    Types: Cigarettes   Smokeless tobacco: Never Used   Tobacco comment: 1 pack per week   Vaping Use   Vaping Use: Never used  Substance and Sexual Activity   Alcohol use: Yes    Comment: occasional    Drug use: No   Sexual activity: Yes    Birth control/protection: None  Other Topics Concern   Not on file  Social History Narrative   Not on file   Social Determinants of Health   Financial Resource Strain:    Difficulty of Paying Living Expenses: Not on file  Food Insecurity:    Worried About Running Out of Food in the Last Year: Not on file  Ran Out of Food in the Last Year: Not on file  Transportation Needs:    Lack of Transportation (Medical): Not on file   Lack of Transportation (Non-Medical): Not on file  Physical Activity:    Days of Exercise per Week: Not on file   Minutes of Exercise per Session: Not on file  Stress:    Feeling of Stress : Not on file  Social Connections:    Frequency of Communication with Friends and Family: Not on file   Frequency of Social Gatherings with Friends and Family: Not on file   Attends Religious Services: Not on file   Active Member of Clubs or Organizations: Not on file   Attends Banker Meetings: Not on file    Marital Status: Not on file  Intimate Partner Violence:    Fear of Current or Ex-Partner: Not on file   Emotionally Abused: Not on file   Physically Abused: Not on file   Sexually Abused: Not on file    FAMILY HISTORY: No family history on file.  ALLERGIES:  has No Known Allergies.  MEDICATIONS:  Current Outpatient Medications  Medication Sig Dispense Refill   ibuprofen (ADVIL) 800 MG tablet Take 800 mg by mouth 3 (three) times daily as needed.     Multiple Vitamins-Minerals (MULTI FOR HER PO) Multi For Her     oxyCODONE-acetaminophen (PERCOCET/ROXICET) 5-325 MG tablet Take 1 tablet by mouth every 6 (six) hours as needed. (Patient not taking: Reported on 02/29/2020)     No current facility-administered medications for this visit.    REVIEW OF SYSTEMS:   Constitutional: ( - ) fevers, ( - )  chills , ( - ) night sweats Eyes: ( - ) blurriness of vision, ( - ) double vision, ( - ) watery eyes Ears, nose, mouth, throat, and face: ( - ) mucositis, ( - ) sore throat Respiratory: ( - ) cough, ( - ) dyspnea, ( - ) wheezes Cardiovascular: ( - ) palpitation, ( - ) chest discomfort, ( - ) lower extremity swelling Gastrointestinal:  ( - ) nausea, ( - ) heartburn, ( - ) change in bowel habits Skin: ( - ) abnormal skin rashes Lymphatics: ( - ) new lymphadenopathy, ( - ) easy bruising Neurological: ( - ) numbness, ( - ) tingling, ( - ) new weaknesses Behavioral/Psych: ( - ) mood change, ( - ) new changes  All other systems were reviewed with the patient and are negative.  PHYSICAL EXAMINATION: ECOG PERFORMANCE STATUS: 1 - Symptomatic but completely ambulatory  Vitals:   02/29/20 1318  BP: (!) 145/73  Pulse: 86  Resp: 18  Temp: 98.1 F (36.7 C)  SpO2: 97%   Filed Weights   02/29/20 1318  Weight: 258 lb 1.6 oz (117.1 kg)    GENERAL: well appearing middle aged [place] American female in NAD  SKIN: skin color, texture, turgor are normal, no rashes or significant  lesions EYES: conjunctiva are pink and non-injected, sclera clear LUNGS: clear to auscultation and percussion with normal breathing effort HEART: regular rate & rhythm and no murmurs and no lower extremity edema Musculoskeletal: no cyanosis of digits and no clubbing  PSYCH: alert & oriented x 3, fluent speech NEURO: no focal motor/sensory deficits  LABORATORY DATA:  I have reviewed the data as listed CBC Latest Ref Rng & Units 02/29/2020 11/16/2009 09/20/2007  WBC 4.0 - 10.5 K/uL 8.7 8.9 -  Hemoglobin 12.0 - 15.0 g/dL 11.6(L) 12.0 13.5 POINT OF CARE RESULT  Hematocrit 36 - 46 % 37.9 35.8(L) -  Platelets 150 - 400 K/uL 397 395 -    CMP Latest Ref Rng & Units 11/16/2009  Glucose 70 - 99 mg/dL 82  BUN 6 - 23 mg/dL 9  Creatinine 6.96 - 2.95 mg/dL 0.9  Sodium 284 - 132 mEq/L 141  Potassium 3.5 - 5.1 mEq/L 3.7  Chloride 96 - 112 mEq/L 103  CO2 19 - 32 mEq/L 30  Calcium 8.4 - 10.5 mg/dL 9.6    RADIOGRAPHIC STUDIES: No results found.  ASSESSMENT & PLAN CAILEY TRIGUEROS 49 y.o. female with medical history significant for hypertension who presents for evaluation of iron deficiency anemia.  Review the labs, the records, discussion with the patient the findings most consistent with iron deficiency anemia secondary to GYN bleeding.  The patient underwent endometrial ablation on in June 2021 and therefore has controlled her source of bleeding.  There are no other overt sources of bleeding.  She has been trialed on iron pills before and only has a mildly low hemoglobin.  But in the results of her iron studies we could consider IV iron in order to help bolster her iron stores.  We will wait for those labs to arrive before making a decision of how to proceed forward.  In the interim I would recommend that she eat iron rich foods and continue the iron pills as prescribed.  #Iron Deficiency Anemia 2/2 to GYN Bleeding -- patient has mildly low Hgb with normal MCV. Etiology of her anemia is not clear  based on outside records, however given her heavy menstrual cycles iron deficiency anemia is most likely --if unable to correct iron deficiency with PO medications can try IV Feraheme 510mg  q 7 days x 2 doses --if iron deficiency is not the issue consider further nutritional studies to include vitamin b12, folate, MMA, and homocysteine. --RTC in 3 months or sooner if IV iron is required.    No orders of the defined types were placed in this encounter.  All questions were answered. The patient knows to call the clinic with any problems, questions or concerns.  A total of more than 60 minutes were spent on this encounter and over half of that time was spent on counseling and coordination of care as outlined above.   , MD Department of Hematology/Oncology [facility] Phone: [phone] Pager: [phone] Email: [phone]  02/29/2020 1:23 PM

## 2020-03-03 DIAGNOSIS — D649 Anemia, unspecified: Secondary | ICD-10-CM

## 2020-03-03 HISTORY — DX: Anemia, unspecified: D64.9

## 2020-03-03 LAB — IRON AND TIBC
Iron: 20 ug/dL — ABNORMAL LOW (ref 41–142)
Saturation Ratios: 5 % — ABNORMAL LOW (ref 21–57)
TIBC: 396 ug/dL (ref 236–444)
UIBC: 376 ug/dL (ref 120–384)

## 2020-03-03 LAB — FERRITIN: Ferritin: 6 ng/mL — ABNORMAL LOW (ref 11–307)

## 2020-03-04 ENCOUNTER — Telehealth: Payer: Self-pay | Admitting: Hematology and Oncology

## 2020-03-04 NOTE — Telephone Encounter (Signed)
Scheduled per 11/26 los. Called and spoke with pt, confirmed 2/28 appts

## 2020-03-05 ENCOUNTER — Telehealth: Payer: Self-pay | Admitting: *Deleted

## 2020-03-05 NOTE — Telephone Encounter (Signed)
LM with note below. To call if has any questions 

## 2020-03-05 NOTE — Telephone Encounter (Signed)
-----   Message from Jaci Standard, MD sent at 03/05/2020 11:39 AM EST ----- Please let Julie Bowers know that her iron levels are low, however her Hgb is stable >11.0. We should be able to increase her iron levels with PO iron therapy alone. Encourage her to continue taking iron pills daily with a source of vitamin C. If this fails to improve her Hgb by our next visit we will set her up for IV iron.  ----- Message ----- From: Leory Plowman, Lab [facility] [place] Sent: 02/29/2020   1:17 PM EST To: Jaci Standard, MD

## 2020-06-01 ENCOUNTER — Other Ambulatory Visit: Payer: Self-pay | Admitting: Hematology and Oncology

## 2020-06-01 DIAGNOSIS — D500 Iron deficiency anemia secondary to blood loss (chronic): Secondary | ICD-10-CM

## 2020-06-01 NOTE — Progress Notes (Signed)
No show

## 2020-06-02 ENCOUNTER — Inpatient Hospital Stay: Payer: Self-pay | Attending: Hematology and Oncology | Admitting: Hematology and Oncology

## 2020-06-02 ENCOUNTER — Inpatient Hospital Stay: Payer: Self-pay

## 2020-06-02 DIAGNOSIS — D500 Iron deficiency anemia secondary to blood loss (chronic): Secondary | ICD-10-CM

## 2020-06-09 ENCOUNTER — Telehealth: Payer: Self-pay | Admitting: Hematology and Oncology

## 2020-06-09 NOTE — Telephone Encounter (Signed)
Scheduled appointments per 03/06 schedule message. Attempted to contact patient, left a detailed message.

## 2020-06-30 ENCOUNTER — Inpatient Hospital Stay: Payer: Self-pay | Attending: Hematology and Oncology

## 2020-06-30 ENCOUNTER — Inpatient Hospital Stay: Payer: Self-pay | Admitting: Hematology and Oncology

## 2021-01-10 ENCOUNTER — Encounter (HOSPITAL_BASED_OUTPATIENT_CLINIC_OR_DEPARTMENT_OTHER): Payer: Self-pay | Admitting: Emergency Medicine

## 2021-01-10 ENCOUNTER — Other Ambulatory Visit: Payer: Self-pay

## 2021-01-10 ENCOUNTER — Emergency Department (HOSPITAL_BASED_OUTPATIENT_CLINIC_OR_DEPARTMENT_OTHER)
Admission: EM | Admit: 2021-01-10 | Discharge: 2021-01-10 | Disposition: A | Discharge status: Home / Self Care | Payer: BC Managed Care – PPO | Attending: Emergency Medicine | Admitting: Emergency Medicine

## 2021-01-10 ENCOUNTER — Emergency Department (HOSPITAL_BASED_OUTPATIENT_CLINIC_OR_DEPARTMENT_OTHER): Payer: BC Managed Care – PPO

## 2021-01-10 DIAGNOSIS — S91332A Puncture wound without foreign body, left foot, initial encounter: Secondary | ICD-10-CM | POA: Diagnosis not present

## 2021-01-10 DIAGNOSIS — W450XXA Nail entering through skin, initial encounter: Secondary | ICD-10-CM | POA: Diagnosis not present

## 2021-01-10 DIAGNOSIS — S91302A Unspecified open wound, left foot, initial encounter: Secondary | ICD-10-CM | POA: Diagnosis present

## 2021-01-10 DIAGNOSIS — T148XXA Other injury of unspecified body region, initial encounter: Secondary | ICD-10-CM

## 2021-01-10 DIAGNOSIS — I10 Essential (primary) hypertension: Secondary | ICD-10-CM | POA: Insufficient documentation

## 2021-01-10 DIAGNOSIS — Z23 Encounter for immunization: Secondary | ICD-10-CM | POA: Diagnosis not present

## 2021-01-10 DIAGNOSIS — Z87891 Personal history of nicotine dependence: Secondary | ICD-10-CM | POA: Diagnosis not present

## 2021-01-10 MED ORDER — CEPHALEXIN 250 MG PO CAPS
500.0000 mg | ORAL_CAPSULE | Freq: Once | ORAL | Status: AC
  Administered 2021-01-10: 500 mg via ORAL
  Filled 2021-01-10: qty 2

## 2021-01-10 MED ORDER — TETANUS-DIPHTH-ACELL PERTUSSIS 5-2.5-18.5 LF-MCG/0.5 IM SUSY
0.5000 mL | PREFILLED_SYRINGE | Freq: Once | INTRAMUSCULAR | Status: AC
  Administered 2021-01-10: 0.5 mL via INTRAMUSCULAR
  Filled 2021-01-10: qty 0.5

## 2021-01-10 MED ORDER — CEPHALEXIN 500 MG PO CAPS: 500.0000 mg | ORAL_CAPSULE | Freq: Two times a day (BID) | ORAL | 0 refills | Status: AC

## 2021-01-10 NOTE — ED Provider Notes (Signed)
[facility] EMERGENCY DEPT Provider Note   CSN: [id number] Arrival date & time: 01/10/21  2008     History Chief Complaint  Patient presents with   Puncture Wound    Julie Bowers is a 50 y.o. female.  Patient stepped on a nail to the bottom of her left foot while wearing shoes.  She is able to take the nail and shoe off.  Tetanus shot not up-to-date.  Denies any pain.  The history is provided by the patient.  Illness Severity:  Mild Onset quality:  Gradual Progression:  Resolved Chronicity:  New Relieved by:  NOTHING Worsened by:  NOTHING Associated symptoms: no fever       Past Medical History:  Diagnosis Date   Anemia    Hypertension     Patient Active Problem List   Diagnosis Date Noted   Right knee pain 11/23/2019    Past Surgical History:  Procedure Laterality Date   FRACTURE SURGERY       OB History   No obstetric history on file.     Family History  Problem Relation Age of Onset   Anemia Mother    Stomach cancer Mother    Heart disease Father    Anemia Sister    Stomach cancer Brother     Social History   Tobacco Use   Smoking status: Former    Types: Cigarettes    Quit date: 2015    Years since quitting: 7.7   Smokeless tobacco: Never   Tobacco comments:    1 pack per week   Vaping Use   Vaping Use: Never used  Substance Use Topics   Alcohol use: Yes    Comment: occasional    Drug use: No    Home Medications Prior to Admission medications   Medication Sig Start Date End Date Taking? Authorizing Provider  cephALEXin (KEFLEX) 500 MG capsule Take 1 capsule (500 mg total) by mouth 2 (two) times daily for 10 days. 01/10/21 01/20/21 Yes Audel Coakley, DO  ibuprofen (ADVIL) 800 MG tablet Take 800 mg by mouth 3 (three) times daily as needed. 11/21/19   [provider]  Multiple Vitamins-Minerals (MULTI FOR HER PO) Multi For Her    [provider]  oxyCODONE-acetaminophen (PERCOCET/ROXICET) 5-325 MG  tablet Take 1 tablet by mouth every 6 (six) hours as needed. Patient not taking: Reported on 02/29/2020 11/21/19   [provider]    Allergies    Patient has no known allergies.  Review of Systems   Review of Systems  Constitutional:  Negative for fever.  Musculoskeletal:  Negative for arthralgias, gait problem and joint swelling.  Skin:  Positive for wound.  Neurological:  Negative for weakness and numbness.   Physical Exam Updated Vital Signs BP (!) 159/75 (BP Location: Right Arm)   Pulse 76   Resp 20   Ht 5\' 9"  (1.753 m)   Wt 112 kg   SpO2 100%   BMI 36.48 kg/m   Physical Exam Constitutional:      General: She is not in acute distress.    Appearance: She is not ill-appearing.  Cardiovascular:     Pulses: Normal pulses.  Musculoskeletal:        General: No tenderness. Normal range of motion.  Skin:    General: Skin is warm.     Comments: Small puncture wound to the bottom of the left foot, hemostatic, no foreign body  Neurological:     General: No focal deficit present.  Mental Status: She is alert.     Sensory: No sensory deficit.     Motor: No weakness.    ED Results / Procedures / Treatments   Labs (all labs ordered are listed, but only abnormal results are displayed) Labs Reviewed - No data to display  EKG None  Radiology No results found.  Procedures Procedures   Medications Ordered in ED Medications  Tdap (BOOSTRIX) injection 0.5 mL (0.5 mLs Intramuscular Given 01/10/21 2127)  cephALEXin (KEFLEX) capsule 500 mg (500 mg Oral Given 01/10/21 2147)    ED Course  I have reviewed the triage vital signs and the nursing notes.  Pertinent labs & imaging results that were available during my care of the patient were reviewed by me and considered in my medical decision making (see chart for details).    MDM Rules/Calculators/A&P                           Julie Bowers is here after stepping on nail while wearing her shoes.  She is  able to take off the shoe and the nail.  Tetanus shot was updated.  She has small puncture wound to the bottom of her foot.  No retained foreign body seen on exam.  Patient did not want x-ray.  Will prophylactically treat with Keflex.  Wound care instructions were given.  Overall she appears well.  This happened just prior to arrival.  She understands return precautions and discharged in good condition.  This chart was dictated using voice recognition software.  Despite best efforts to proofread,  errors can occur which can change the documentation meaning.   Final Clinical Impression(s) / ED Diagnoses Final diagnoses:  Wound, open, puncture    Rx / DC Orders ED Discharge Orders          Ordered    cephALEXin (KEFLEX) 500 MG capsule  2 times daily        01/10/21 2203             Virgina Norfolk, DO 01/10/21 2209

## 2021-01-10 NOTE — Discharge Instructions (Addendum)
Please keep the bottom of your foot clean and dry.  Recommend bacitracin or Neosporin twice a day.  Please return if you develop any signs of worsening infection.

## 2021-01-10 NOTE — ED Triage Notes (Signed)
left plantar puncture wound, stepped on nail today  . Bleeding controlled

## 2021-02-12 ENCOUNTER — Encounter (HOSPITAL_BASED_OUTPATIENT_CLINIC_OR_DEPARTMENT_OTHER): Payer: Self-pay | Admitting: Nurse Practitioner

## 2021-02-12 ENCOUNTER — Ambulatory Visit (INDEPENDENT_AMBULATORY_CARE_PROVIDER_SITE_OTHER): Payer: BC Managed Care – PPO | Admitting: Nurse Practitioner

## 2021-02-12 ENCOUNTER — Other Ambulatory Visit: Payer: Self-pay

## 2021-02-12 VITALS — BP 140/110 | Temp 81.0°F | Ht 69.0 in | Wt 257.0 lb

## 2021-02-12 DIAGNOSIS — I10 Essential (primary) hypertension: Secondary | ICD-10-CM

## 2021-02-12 DIAGNOSIS — Z1321 Encounter for screening for nutritional disorder: Secondary | ICD-10-CM

## 2021-02-12 DIAGNOSIS — Z13228 Encounter for screening for other metabolic disorders: Secondary | ICD-10-CM

## 2021-02-12 DIAGNOSIS — Z130 Encounter for screening for diseases of the blood and blood-forming organs and certain disorders involving the immune mechanism: Secondary | ICD-10-CM

## 2021-02-12 DIAGNOSIS — Z0000 Encounter for general adult medical examination without abnormal findings: Secondary | ICD-10-CM

## 2021-02-12 DIAGNOSIS — Z1329 Encounter for screening for other suspected endocrine disorder: Secondary | ICD-10-CM | POA: Diagnosis not present

## 2021-02-12 MED ORDER — BLOOD PRESSURE CUFF MISC: 0 refills | Status: DC

## 2021-02-12 MED ORDER — AMLODIPINE BESYLATE 5 MG PO TABS: 5.0000 mg | ORAL_TABLET | Freq: Every day | ORAL | 1 refills | Status: DC

## 2021-02-12 NOTE — Progress Notes (Signed)
Julie Render, DNP, AGNP-c Primary Care & Sports Medicine [address]  [place], [place] [address] [phone]  New patient visit   Patient: Julie Bowers   DOB: [birth year]   50 y.o. Female  MRN: [id number] Visit Date: 02/12/2021  Patient Care Team: Julie Bowers, Julie Pesa, NP as PCP - General (Nurse Practitioner)  Today's healthcare provider: Orma Render, NP   Chief Complaint  Patient presents with   Dizziness    Patient has been feeling light headed and dizzy x 1 month. [employer] came to her work and informed her BP was high and recommend that she see a physician. She was seen in ED x 3wks ago downstairs, she stepped on a nail. BP was still high. She is complaining of headache today.    Subjective    Julie Bowers is a 50 y.o. female who presents today as a new patient to establish care.  HPI  Consuelo endorses dizziness and lightheadedness for approximately the past month. She was seen recently in the emergency room after stepping on a nail and she was told that her blood pressures were elevated. She was also told at work that her blood pressures were elevated with a biometric check. She has no known history of hypertension however her mother and sister also have hypertension. She has not had labs in quite some time. She denies chest pains, shortness of breath, weakness, or fatigue.  She has no known vision changes.  Past Medical History:  Diagnosis Date   Anemia    Hypertension    Past Surgical History:  Procedure Laterality Date   FRACTURE SURGERY     Family Status  Relation Name Status   Mother  (Not Specified)   Father  (Not Specified)   Sister  (Not Specified)   Brother  (Not Specified)   Family History  Problem Relation Age of Onset   Anemia Mother    Stomach cancer Mother    Heart disease Father    Anemia Sister    Stomach cancer Brother    Social History   Socioeconomic History   Marital status: Divorced    Spouse  name: Not on file   Number of children: Not on file   Years of education: Not on file   Highest education level: Not on file  Occupational History   Not on file  Tobacco Use   Smoking status: Former    Types: Cigarettes    Quit date: 2015    Years since quitting: 7.8   Smokeless tobacco: Never   Tobacco comments:    1 pack per week   Vaping Use   Vaping Use: Never used  Substance and Sexual Activity   Alcohol use: Yes    Comment: occasional    Drug use: No   Sexual activity: Yes    Birth control/protection: None  Other Topics Concern   Not on file  Social History Narrative   Not on file   Social Determinants of Health   Financial Resource Strain: Not on file  Food Insecurity: Not on file  Transportation Needs: Not on file  Physical Activity: Not on file  Stress: Not on file  Social Connections: Not on file   Outpatient Medications Prior to Visit  Medication Sig   chlorhexidine (PERIDEX) 0.12 % solution 15 mLs 2 (two) times daily.   [DISCONTINUED] ibuprofen (ADVIL) 800 MG tablet Take 800 mg by mouth 3 (three) times daily as needed. (Patient not taking: No  sig reported)   [DISCONTINUED] Multiple Vitamins-Minerals (MULTI FOR HER PO) Multi For Her (Patient not taking: No sig reported)   [DISCONTINUED] oxyCODONE-acetaminophen (PERCOCET/ROXICET) 5-325 MG tablet Take 1 tablet by mouth every 6 (six) hours as needed. (Patient not taking: No sig reported)   No facility-administered medications prior to visit.   No Known Allergies  Immunization History  Administered Date(s) Administered   Tdap 01/10/2021    Health Maintenance  Topic Date Due   COVID-19 Vaccine (1) Never done   HIV Screening  Never done   Hepatitis C Screening  Never done   PAP SMEAR-Modifier  Never done   COLONOSCOPY (Pts 45-27yr Insurance coverage will need to be confirmed)  Never done   MAMMOGRAM  Never done   Zoster Vaccines- Shingrix (1 of 2) Never done   INFLUENZA VACCINE  01/04/2022  (Originally 11/03/2020)   TETANUS/TDAP  01/11/2031   Pneumococcal Vaccine [id number] old  Aged Out   HPV VACCINES  Aged Out    Patient Care Team: Julie Bowers, SCoralee Pesa NP as PCP - General (Nurse Practitioner)  Review of Systems All review of systems negative except what is listed in the HPI    Objective    BP (!) 140/110   Temp (!) 81 F (27.2 C)   Ht 5' 9"  (1.753 m)   Wt 257 lb (116.6 kg)   LMP 02/13/2020   SpO2 98%   BMI 37.95 kg/m  Physical Exam   Depression Screen PHQ 2/9 Scores 02/13/2021  PHQ - 2 Score 0  PHQ- 9 Score 5   Results for orders placed or performed in visit on 02/12/21  Comprehensive metabolic panel  Result Value Ref Range   Glucose 84 70 - 99 mg/dL   BUN 11 6 - 24 mg/dL   Creatinine, Ser 0.85 0.57 - 1.00 mg/dL   eGFR 83 >59 mL/min/1.73   BUN/Creatinine Ratio 13 9 - 23   Sodium 138 134 - 144 mmol/L   Potassium 4.1 3.5 - 5.2 mmol/L   Chloride 100 96 - 106 mmol/L   CO2 27 20 - 29 mmol/L   Calcium 9.8 8.7 - 10.2 mg/dL   Total Protein 7.2 6.0 - 8.5 g/dL   Albumin 4.7 3.8 - 4.9 g/dL   Globulin, Total 2.5 1.5 - 4.5 g/dL   Albumin/Globulin Ratio 1.9 1.2 - 2.2   Bilirubin Total 0.3 0.0 - 1.2 mg/dL   Alkaline Phosphatase 98 44 - 121 IU/L   AST 20 0 - 40 IU/L   ALT 20 0 - 32 IU/L  VITAMIN D 25 Hydroxy (Vit-D Deficiency, Fractures)  Result Value Ref Range   Vit D, 25-Hydroxy 16.7 (L) 30.0 - 100.0 ng/mL  TSH  Result Value Ref Range   TSH 2.110 0.450 - 4.500 uIU/mL  CBC with Differential/Platelet  Result Value Ref Range   WBC 7.7 3.4 - 10.8 x10E3/uL   RBC 4.36 3.77 - 5.28 x10E6/uL   Hemoglobin 12.6 11.1 - 15.9 g/dL   Hematocrit 38.0 34.0 - 46.6 %   MCV 87 79 - 97 fL   MCH 28.9 26.6 - 33.0 pg   MCHC 33.2 31.5 - 35.7 g/dL   RDW 13.7 11.7 - 15.4 %   Platelets 427 150 - 450 x10E3/uL   Neutrophils 47 Not Estab. %   Lymphs 36 Not Estab. %   Monocytes 11 Not Estab. %   Eos 4 Not Estab. %   Basos 1 Not Estab. %   Neutrophils Absolute 3.6 1.4 - 7.0  x10E3/uL   Lymphocytes Absolute 2.8 0.7 - 3.1 x10E3/uL   Monocytes Absolute 0.9 0.1 - 0.9 x10E3/uL   EOS (ABSOLUTE) 0.3 0.0 - 0.4 x10E3/uL   Basophils Absolute 0.1 0.0 - 0.2 x10E3/uL   Immature Granulocytes 1 Not Estab. %   Immature Grans (Abs) 0.1 0.0 - 0.1 x10E3/uL  Hemoglobin A1c  Result Value Ref Range   Hgb A1c MFr Bld 6.0 (H) 4.8 - 5.6 %   Est. average glucose Bld gHb Est-mCnc 126 mg/dL    Assessment & Plan      Problem List Items Addressed This Visit     Primary hypertension - Primary    Hypertension present today with blood pressure 140/110. She is experiencing symptoms of dizziness and headaches associated with the elevation in blood pressures. She has not been started on any medications at this time. Discussion with the patient on the option of beginning medications and joint decision made to begin amlodipine 5 mg. We will likely need to taper up on this medication or add a thiazide to establish good control but do not want to drop her blood pressures too quickly and result in falls or hypotension. We will obtain labs today for further evaluation and make recommendations to plan of care based on labs. We will plan to follow-up in 1 to 2 weeks with blood pressure report. Prescription provided today for blood pressure cuff if covered by insurance.      Relevant Medications   amLODipine (NORVASC) 5 MG tablet   Blood Pressure Monitoring (BLOOD PRESSURE CUFF) MISC   Other Relevant Orders   Comprehensive metabolic panel (Completed)   CBC with Differential/Platelet (Completed)   Encounter for medical examination to establish care    Review of current and past medical history, social history, medication, and family history.  Review of care gaps and health maintenance recommendations.  Records from recent providers to be requested if not available in Chart Review or Care Everywhere.  Recommendations for health maintenance, diet, and exercise provided.  Labs today: CBC, CMP HM  Recommendations: We will evaluate health maintenance with records review. CPE due: In the next few weeks once blood pressure is under control.       Other Visit Diagnoses     Screening for deficiency anemia       Relevant Orders   CBC with Differential/Platelet (Completed)   Screening for endocrine, nutritional, metabolic and immunity disorder       Relevant Orders   Comprehensive metabolic panel (Completed)   VITAMIN D 25 Hydroxy (Vit-D Deficiency, Fractures) (Completed)   TSH (Completed)   CBC with Differential/Platelet (Completed)   Hemoglobin A1c (Completed)        Return in about 4 weeks (around 03/12/2021) for HTN.      Gimena Buick, Julie Pesa, NP, DNP, AGNP-C Primary Care & Sports Medicine at [place]

## 2021-02-12 NOTE — Patient Instructions (Addendum)
Thank you for choosing [place] at [facility] for your Primary Care needs. I am excited for the opportunity to partner with you to meet your health care goals. It was a pleasure meeting you today!  Recommendations from today's visit: I would like to get you started on a medication called Amlodipine. I have attached information on this medication on the back of this sheet for you to review.  If your insurance covers the blood pressure monitor, or if you can get it inexpensively, I recommend that you get one and monitor your blood pressure once a day (usually in the mornings) and write down what the results are.  I will let you know if your labs show anything abnormal.    Information on diet, exercise, and health maintenance recommendations are listed below. This is information to help you be sure you are on track for optimal health and monitoring.   Please look over this and let us know if you have any questions or if you have completed any of the health maintenance outside of [place] so that we can be sure your records are up to date.  ___________________________________________________________ About Me: I am an Adult-Geriatric Nurse Practitioner with a background in caring for patients for more than 20 years with a strong intensive care background. I provide primary care and sports medicine services to patients age 15 and older within this office. My education had a strong focus on caring for the older adult population, which I am passionate about. I am also the director of the APP Fellowship with [facility].   My desire is to provide you with the best service through preventive medicine and supportive care. I consider you a part of the medical team and value your input. I work diligently to ensure that you are heard and your needs are met in a safe and effective manner. I want you to feel comfortable with me as your provider and want you to know that your health concerns  are important to me.  For your information, our office hours are: Monday, Tuesday, and Thursday 8:00 AM - 5:00 PM Wednesday and Friday 8:00 AM - 12:00 PM.   In my time away from the office I am teaching new APP's within the system and am unavailable, but my partner, Dr. Burnard Bunting is in the office for emergent needs.   If you have questions or concerns, please call our office at [phone] or send [place] a MyChart message and we will respond as quickly as possible.  ____________________________________________________________ MyChart:  For all urgent or time sensitive needs we ask that you please call the office to avoid delays. Our number is [phone]. MyChart is not constantly monitored and due to the large volume of messages a day, replies may take up to 72 business hours.  MyChart Policy: MyChart allows for you to see your visit notes, after visit summary, provider recommendations, lab and tests results, make an appointment, request refills, and contact your provider or the office for non-urgent questions or concerns. Providers are seeing patients during normal business hours and do not have built in time to review MyChart messages.  We ask that you allow a minimum of 3 business days for responses to [employer]. For this reason, please do not send urgent requests through [place]. Please call the office at [phone]. New and ongoing conditions may require a visit. We have virtual and in person visit available for your convenience.  Complex MyChart concerns may require a  visit. Your provider may request you schedule a virtual or in person visit to ensure we are providing the best care possible. MyChart messages sent after 11:00 AM on Friday will not be received by the provider until Monday morning.    Lab and Test Results: You will receive your lab and test results on MyChart as soon as they are completed and results have been sent by the lab or testing facility. Due to this service,  you will receive your results BEFORE your provider.  I review lab and tests results each morning prior to seeing patients. Some results require collaboration with other providers to ensure you are receiving the most appropriate care. For this reason, we ask that you please allow a minimum of 3-5 business days from the time the ALL results have been received for your provider to receive and review lab and test results and contact you about these.  Most lab and test result comments from the provider will be sent through [place]. Your provider may recommend changes to the plan of care, follow-up visits, repeat testing, ask questions, or request an office visit to discuss these results. You may reply directly to this message or call the office at [phone] to provide information for the provider or set up an appointment. In some instances, you will be called with test results and recommendations. Please let us know if this is preferred and we will make note of this in your chart to provide this for you.    If you have not heard a response to your lab or test results in 5 business days from all results returning to [place], please call the office to let us know. We ask that you please avoid calling prior to this time unless there is an emergent concern. Due to high call volumes, this can delay the resulting process.  After Hours: For all non-emergency after hours needs, please call the office at [phone] and select the option to reach the on-call provider service. On-call services are shared between multiple [place] offices and therefore it will not be possible to speak directly with your provider. On-call providers may provide medical advice and recommendations, but are unable to provide refills for maintenance medications.  For all emergency or urgent medical needs after normal business hours, we recommend that you seek care at the closest Urgent Care or Emergency Department to ensure appropriate  treatment in a timely manner.  [facility] at [place] has a 24 hour emergency room located on the ground floor for your convenience.   Urgent Concerns During the Business Day Providers are seeing patients from 8AM to [place] with a busy schedule and are most often not able to respond to non-urgent calls until the end of the day or the next business day. If you should have URGENT concerns during the day, please call and speak to the nurse or schedule a same day appointment so that we can address your concern without delay.   Thank you, again, for choosing me as your health care partner. I appreciate your trust and look forward to learning more about you.   Worthy Keeler, DNP, AGNP-c ___________________________________________________________  Health Maintenance Recommendations Screening Testing Mammogram Every 1 -2 years based on history and risk factors Starting at age 60 Pap Smear Ages 21-39 every 3 years Ages 23-65 every 5 years with HPV testing More frequent testing may be required based on results and history Colon Cancer Screening Every 1-10 years based on test performed, risk factors, and  history Starting at age 75 Bone Density Screening Every 2-10 years based on history Starting at age 7 for women Recommendations for men differ based on medication usage, history, and risk factors AAA Screening One time ultrasound Men 60-35 years old who have every smoked Lung Cancer Screening Low Dose Lung CT every 12 months Age 15-80 years with a 30 pack-year smoking history who still smoke or who have quit within the last 15 years  Screening Labs Routine  Labs: Complete Blood Count (CBC), Complete Metabolic Panel (CMP), Cholesterol (Lipid Panel) Every 6-12 months based on history and medications May be recommended more frequently based on current conditions or previous results Hemoglobin A1c Lab Every 3-12 months based on history and previous results Starting at age 74 or  earlier with diagnosis of diabetes, high cholesterol, BMI >26, and/or risk factors Frequent monitoring for patients with diabetes to ensure blood sugar control Thyroid Panel (TSH w/ T3 & T4) Every 6 months based on history, symptoms, and risk factors May be repeated more often if on medication HIV One time testing for all patients 7 and older May be repeated more frequently for patients with increased risk factors or exposure Hepatitis C One time testing for all patients 35 and older May be repeated more frequently for patients with increased risk factors or exposure Gonorrhea, Chlamydia Every 12 months for all sexually active persons 13-24 years Additional monitoring may be recommended for those who are considered high risk or who have symptoms PSA Men 38-82 years old with risk factors Additional screening may be recommended from age 35-69 based on risk factors, symptoms, and history  Vaccine Recommendations Tetanus Booster All adults every 10 years Flu Vaccine All patients 6 months and older every year COVID Vaccine All patients 12 years and older Initial dosing with booster May recommend additional booster based on age and health history HPV Vaccine 2 doses all patients age 48-26 Dosing may be considered for patients over 26 Shingles Vaccine (Shingrix) 2 doses all adults 82 years and older Pneumonia (Pneumovax 23) All adults 58 years and older May recommend earlier dosing based on health history Pneumonia (Prevnar 57) All adults 34 years and older Dosed 1 year after Pneumovax 23  Additional Screening, Testing, and Vaccinations may be recommended on an individualized basis based on family history, health history, risk factors, and/or exposure.  __________________________________________________________  Diet Recommendations for All Patients  I recommend that all patients maintain a diet low in saturated fats, carbohydrates, and cholesterol. While this can be challenging  at first, it is not impossible and small changes can make big differences.  Things to try: Decreasing the amount of soda, sweet tea, and/or juice to one or less per day and replace with water While water is always the first choice, if you do not like water you may consider adding a water additive without sugar to improve the taste other sugar free drinks Replace potatoes with a brightly colored vegetable at dinner Use healthy oils, such as canola oil or olive oil, instead of butter or hard margarine Limit your bread intake to two pieces or less a day Replace regular pasta with low carb pasta options Bake, broil, or grill foods instead of frying Monitor portion sizes  Eat smaller, more frequent meals throughout the day instead of large meals  An important thing to remember is, if you love foods that are not great for your health, you don't have to give them up completely. Instead, allow these foods to be a reward when  you have done well. Allowing yourself to still have special treats every once in a while is a nice way to tell yourself thank you for working hard to keep yourself healthy.   Also remember that every day is a new day. If you have a bad day and "fall off the wagon", you can still climb right back up and keep moving along on your journey!  We have resources available to help you!  Some websites that may be helpful include: www.http://carter.biz/  Www.VeryWellFit.com _____________________________________________________________  Activity Recommendations for All Patients  I recommend that all adults get at least 20 minutes of moderate physical activity that elevates your heart rate at least 5 days out of the week.  Some examples include: Walking or jogging at a pace that allows you to carry on a conversation Cycling (stationary bike or outdoors) Water aerobics Yoga Weight lifting Dancing If physical limitations prevent you from putting stress on your joints, exercise in a pool or  seated in a chair are excellent options.  Do determine your MAXIMUM heart rate for activity: YOUR AGE - 220 = MAX HeartRate   Remember! Do not push yourself too hard.  Start slowly and build up your pace, speed, weight, time in exercise, etc.  Allow your body to rest between exercise and get good sleep. You will need more water than normal when you are exerting yourself. Do not wait until you are thirsty to drink. Drink with a purpose of getting in at least 8, 8 ounce glasses of water a day plus more depending on how much you exercise and sweat.    If you begin to develop dizziness, chest pain, abdominal pain, jaw pain, shortness of breath, headache, vision changes, lightheadedness, or other concerning symptoms, stop the activity and allow your body to rest. If your symptoms are severe, seek emergency evaluation immediately. If your symptoms are concerning, but not severe, please let us know so that we can recommend further evaluation.

## 2021-02-13 DIAGNOSIS — Z0000 Encounter for general adult medical examination without abnormal findings: Secondary | ICD-10-CM | POA: Insufficient documentation

## 2021-02-13 DIAGNOSIS — I10 Essential (primary) hypertension: Secondary | ICD-10-CM | POA: Insufficient documentation

## 2021-02-13 HISTORY — DX: Encounter for general adult medical examination without abnormal findings: Z00.00

## 2021-02-13 LAB — CBC WITH DIFFERENTIAL/PLATELET
Basophils Absolute: 0.1 10*3/uL (ref 0.0–0.2)
Basos: 1 %
EOS (ABSOLUTE): 0.3 10*3/uL (ref 0.0–0.4)
Eos: 4 %
Hematocrit: 38 % (ref 34.0–46.6)
Hemoglobin: 12.6 g/dL (ref 11.1–15.9)
Immature Grans (Abs): 0.1 10*3/uL (ref 0.0–0.1)
Immature Granulocytes: 1 %
Lymphocytes Absolute: 2.8 10*3/uL (ref 0.7–3.1)
Lymphs: 36 %
MCH: 28.9 pg (ref 26.6–33.0)
MCHC: 33.2 g/dL (ref 31.5–35.7)
MCV: 87 fL (ref 79–97)
Monocytes Absolute: 0.9 10*3/uL (ref 0.1–0.9)
Monocytes: 11 %
Neutrophils Absolute: 3.6 10*3/uL (ref 1.4–7.0)
Neutrophils: 47 %
Platelets: 427 10*3/uL (ref 150–450)
RBC: 4.36 x10E6/uL (ref 3.77–5.28)
RDW: 13.7 % (ref 11.7–15.4)
WBC: 7.7 10*3/uL (ref 3.4–10.8)

## 2021-02-13 LAB — COMPREHENSIVE METABOLIC PANEL
ALT: 20 IU/L (ref 0–32)
AST: 20 IU/L (ref 0–40)
Albumin/Globulin Ratio: 1.9 (ref 1.2–2.2)
Albumin: 4.7 g/dL (ref 3.8–4.9)
Alkaline Phosphatase: 98 IU/L (ref 44–121)
BUN/Creatinine Ratio: 13 (ref 9–23)
BUN: 11 mg/dL (ref 6–24)
Bilirubin Total: 0.3 mg/dL (ref 0.0–1.2)
CO2: 27 mmol/L (ref 20–29)
Calcium: 9.8 mg/dL (ref 8.7–10.2)
Chloride: 100 mmol/L (ref 96–106)
Creatinine, Ser: 0.85 mg/dL (ref 0.57–1.00)
Globulin, Total: 2.5 g/dL (ref 1.5–4.5)
Glucose: 84 mg/dL (ref 70–99)
Potassium: 4.1 mmol/L (ref 3.5–5.2)
Sodium: 138 mmol/L (ref 134–144)
Total Protein: 7.2 g/dL (ref 6.0–8.5)
eGFR: 83 mL/min/{1.73_m2} (ref >59)

## 2021-02-13 LAB — HEMOGLOBIN A1C
Est. average glucose Bld gHb Est-mCnc: 126 mg/dL
Hgb A1c MFr Bld: 6 % — ABNORMAL HIGH (ref 4.8–5.6)

## 2021-02-13 LAB — VITAMIN D 25 HYDROXY (VIT D DEFICIENCY, FRACTURES): Vit D, 25-Hydroxy: 16.7 ng/mL — ABNORMAL LOW (ref 30.0–100.0)

## 2021-02-13 LAB — TSH: TSH: 2.11 u[IU]/mL (ref 0.450–4.500)

## 2021-02-13 NOTE — Assessment & Plan Note (Signed)
Review of current and past medical history, social history, medication, and family history.  Review of care gaps and health maintenance recommendations.  Records from recent providers to be requested if not available in Chart Review or Care Everywhere.  Recommendations for health maintenance, diet, and exercise provided.  Labs today: CBC, CMP HM Recommendations: We will evaluate health maintenance with records review. CPE due: In the next few weeks once blood pressure is under control.

## 2021-02-13 NOTE — Assessment & Plan Note (Signed)
Hypertension present today with blood pressure 140/110. She is experiencing symptoms of dizziness and headaches associated with the elevation in blood pressures. She has not been started on any medications at this time. Discussion with the patient on the option of beginning medications and joint decision made to begin amlodipine 5 mg. We will likely need to taper up on this medication or add a thiazide to establish good control but do not want to drop her blood pressures too quickly and result in falls or hypotension. We will obtain labs today for further evaluation and make recommendations to plan of care based on labs. We will plan to follow-up in 1 to 2 weeks with blood pressure report. Prescription provided today for blood pressure cuff if covered by insurance.

## 2021-02-18 ENCOUNTER — Other Ambulatory Visit (HOSPITAL_BASED_OUTPATIENT_CLINIC_OR_DEPARTMENT_OTHER): Payer: Self-pay | Admitting: Nurse Practitioner

## 2021-02-18 DIAGNOSIS — E559 Vitamin D deficiency, unspecified: Secondary | ICD-10-CM

## 2021-02-18 MED ORDER — VITAMIN D (ERGOCALCIFEROL) 1.25 MG (50000 UNIT) PO CAPS: 50000.0000 [IU] | ORAL_CAPSULE | ORAL | 0 refills | Status: AC

## 2021-03-12 ENCOUNTER — Other Ambulatory Visit (HOSPITAL_BASED_OUTPATIENT_CLINIC_OR_DEPARTMENT_OTHER): Payer: Self-pay

## 2021-03-12 ENCOUNTER — Encounter (HOSPITAL_BASED_OUTPATIENT_CLINIC_OR_DEPARTMENT_OTHER): Payer: Self-pay | Admitting: Nurse Practitioner

## 2021-03-12 ENCOUNTER — Other Ambulatory Visit: Payer: Self-pay

## 2021-03-12 ENCOUNTER — Ambulatory Visit (INDEPENDENT_AMBULATORY_CARE_PROVIDER_SITE_OTHER): Payer: BC Managed Care – PPO | Admitting: Nurse Practitioner

## 2021-03-12 VITALS — BP 128/72 | HR 92 | Ht 69.0 in | Wt 254.0 lb

## 2021-03-12 DIAGNOSIS — I10 Essential (primary) hypertension: Secondary | ICD-10-CM

## 2021-03-12 DIAGNOSIS — R7303 Prediabetes: Secondary | ICD-10-CM | POA: Diagnosis not present

## 2021-03-12 DIAGNOSIS — Z6837 Body mass index (BMI) 37.0-37.9, adult: Secondary | ICD-10-CM | POA: Diagnosis not present

## 2021-03-12 MED ORDER — OZEMPIC (0.25 OR 0.5 MG/DOSE) 2 MG/1.5ML ~~LOC~~ SOPN
PEN_INJECTOR | SUBCUTANEOUS | 1 refills | Status: DC
  Filled 2021-03-12: qty 1.5, 34d supply, fill #0

## 2021-03-12 MED ORDER — AMLODIPINE BESYLATE 5 MG PO TABS
5.0000 mg | ORAL_TABLET | Freq: Every day | ORAL | 1 refills | Status: DC
  Filled 2021-03-12: qty 30, 30d supply, fill #0

## 2021-03-12 MED ORDER — PEN NEEDLES 32G X 4 MM MISC
11 refills | Status: DC
  Filled 2021-03-12: qty 30, fill #0

## 2021-03-12 NOTE — Progress Notes (Signed)
Established Patient Office Visit  Subjective:  Patient ID: Julie Bowers, female    DOB: 1970/04/25  Age: 50 y.o. MRN: [id number]  CC:  Chief Complaint  Patient presents with   Follow-up    Patient is here to f/u regarding blood pressure. She is feeling and doing great. She is monitoring her BP readings at home they are 132/73. She has started taking iron pills 65 mg daily. She is concerned about her weight gain, she would like to discuss options. She needs refill on Norvasc.     HPI Julie Bowers presents for follow-up for HTN.  She was last seen 1 month ago with concerns for elevated BP on recent ED visit.  At her last visit, her blood pressures remained elevated and she was experiencing symptoms of dizziness and headaches.  We started amlodipine 67m daily for hypertension as initial start.   Her last labs also showed evidence of pre-diabetes with A1c at 6.0%  Today she tells me she has been checking her blood pressures at home and they are running in the 120's-130's/70'-80's. She is not having any side effects of the medication. She denies headache, CP, palpitations, edema.   She would like to discuss weight loss today as she has tried to loose weight with diet and exercise, but has not been successful. She is concerned about her CV health with the new diagnosis of HTN and pre-diabetes.  In the past she has had success with injectable HCG.    Past Medical History:  Diagnosis Date   Anemia    Hypertension     Past Surgical History:  Procedure Laterality Date   FRACTURE SURGERY      Family History  Problem Relation Age of Onset   Anemia Mother    Stomach cancer Mother    Heart disease Father    Anemia Sister    Stomach cancer Brother     Social History   Socioeconomic History   Marital status: Divorced    Spouse name: Not on file   Number of children: Not on file   Years of education: Not on file   Highest education level: Not on file  Occupational  History   Not on file  Tobacco Use   Smoking status: Former    Types: Cigarettes    Quit date: 2015    Years since quitting: 7.9   Smokeless tobacco: Never   Tobacco comments:    1 pack per week   Vaping Use   Vaping Use: Never used  Substance and Sexual Activity   Alcohol use: Yes    Comment: occasional    Drug use: No   Sexual activity: Yes    Birth control/protection: None  Other Topics Concern   Not on file  Social History Narrative   Not on file   Social Determinants of Health   Financial Resource Strain: Not on file  Food Insecurity: Not on file  Transportation Needs: Not on file  Physical Activity: Not on file  Stress: Not on file  Social Connections: Not on file  Intimate Partner Violence: Not on file    Outpatient Medications Prior to Visit  Medication Sig Dispense Refill   Blood Pressure Monitoring (BLOOD PRESSURE CUFF) MISC Electronic blood pressure cuff as covered by insurance for ICD-10 I10 1 each 0   chlorhexidine (PERIDEX) 0.12 % solution 15 mLs 2 (two) times daily.     Vitamin D, Ergocalciferol, (DRISDOL) 1.25 MG (50000 UNIT) CAPS capsule Take 1 capsule (  50,000 Units total) by mouth every 7 (seven) days for 12 doses. 12 capsule 0   amLODipine (NORVASC) 5 MG tablet Take 1 tablet (5 mg total) by mouth daily. 30 tablet 1   No facility-administered medications prior to visit.    No Known Allergies  ROS Review of Systems All review of systems negative except what is listed in the HPI    Objective:    Physical Exam Vitals and nursing note reviewed.  Constitutional:      Appearance: Normal appearance.  HENT:     Head: Normocephalic.  Eyes:     Extraocular Movements: Extraocular movements intact.     Conjunctiva/sclera: Conjunctivae normal.     Pupils: Pupils are equal, round, and reactive to light.  Neck:     Vascular: No carotid bruit.  Cardiovascular:     Rate and Rhythm: Normal rate and regular rhythm.     Pulses: Normal pulses.      Heart sounds: Normal heart sounds.  Pulmonary:     Effort: Pulmonary effort is normal.     Breath sounds: Normal breath sounds.  Musculoskeletal:     Cervical back: Normal range of motion.     Right lower leg: No edema.     Left lower leg: No edema.  Skin:    General: Skin is warm and dry.     Capillary Refill: Capillary refill takes less than 2 seconds.  Neurological:     General: No focal deficit present.     Mental Status: She is alert and oriented to person, place, and time.  Psychiatric:        Mood and Affect: Mood normal.        Behavior: Behavior normal.        Thought Content: Thought content normal.        Judgment: Judgment normal.    BP 128/72   Pulse 92   Ht 5' 9"  (1.753 m)   Wt 254 lb (115.2 kg)   SpO2 97%   BMI 37.51 kg/m  Wt Readings from Last 3 Encounters:  03/12/21 254 lb (115.2 kg)  02/12/21 257 lb (116.6 kg)  01/10/21 247 lb (112 kg)     Health Maintenance Due  Topic Date Due   COVID-19 Vaccine (1) Never done   HIV Screening  Never done   Hepatitis C Screening  Never done   PAP SMEAR-Modifier  Never done   COLONOSCOPY (Pts 45-72yr Insurance coverage will need to be confirmed)  Never done   MAMMOGRAM  Never done   Zoster Vaccines- Shingrix (1 of 2) Never done    There are no preventive care reminders to display for this patient.  Lab Results  Component Value Date   TSH 2.110 02/12/2021   Lab Results  Component Value Date   WBC 7.7 02/12/2021   HGB 12.6 02/12/2021   HCT 38.0 02/12/2021   MCV 87 02/12/2021   PLT 427 02/12/2021   Lab Results  Component Value Date   NA 138 02/12/2021   K 4.1 02/12/2021   CO2 27 02/12/2021   GLUCOSE 84 02/12/2021   BUN 11 02/12/2021   CREATININE 0.85 02/12/2021   BILITOT 0.3 02/12/2021   ALKPHOS 98 02/12/2021   AST 20 02/12/2021   ALT 20 02/12/2021   PROT 7.2 02/12/2021   ALBUMIN 4.7 02/12/2021   CALCIUM 9.8 02/12/2021   ANIONGAP 10 02/29/2020   EGFR 83 02/12/2021   No results found for:  CHOL No results found for: HDL No  results found for: LDLCALC No results found for: TRIG No results found for: CHOLHDL Lab Results  Component Value Date   HGBA1C 6.0 (H) 02/12/2021      Assessment & Plan:   Problem List Items Addressed This Visit     BMI 37.0-37.9, adult   Relevant Medications   amLODipine (NORVASC) 5 MG tablet   Semaglutide,0.25 or 0.5MG/DOS, (OZEMPIC, 0.25 OR 0.5 MG/DOSE,) 2 MG/1.5ML SOPN   Insulin Pen Needle (PEN NEEDLES) 32G X 4 MM MISC   Pre-diabetes   Relevant Medications   amLODipine (NORVASC) 5 MG tablet   Semaglutide,0.25 or 0.5MG/DOS, (OZEMPIC, 0.25 OR 0.5 MG/DOSE,) 2 MG/1.5ML SOPN   Insulin Pen Needle (PEN NEEDLES) 32G X 4 MM MISC   Primary hypertension - Primary   Relevant Medications   amLODipine (NORVASC) 5 MG tablet   Semaglutide,0.25 or 0.5MG/DOS, (OZEMPIC, 0.25 OR 0.5 MG/DOSE,) 2 MG/1.5ML SOPN   Insulin Pen Needle (PEN NEEDLES) 32G X 4 MM MISC    Meds ordered this encounter  Medications   amLODipine (NORVASC) 5 MG tablet    Sig: Take 1 tablet (5 mg total) by mouth daily.    Dispense:  90 tablet    Refill:  1   Semaglutide,0.25 or 0.5MG/DOS, (OZEMPIC, 0.25 OR 0.5 MG/DOSE,) 2 MG/1.5ML SOPN    Sig: Inject 0.7m once a week for 4 weeks then increase dose to 0.581monce a week.    Dispense:  4.5 mL    Refill:  1   Insulin Pen Needle (PEN NEEDLES) 32G X 4 MM MISC    Sig: Use one new needle per week with injection of Ozempic    Dispense:  30 each    Refill:  11    Follow-up: Return in about 4 weeks (around 04/09/2021) for virtual weight.    SaOrma RenderNP

## 2021-03-12 NOTE — Assessment & Plan Note (Signed)
BMI 37.51 today.  In setting of HTN and insulin resistance/prediabetes, discussed weight management options with patient with GLP-1. She has been unsuccessful with diet and exercise alone.  She has used HCG injections in the past, but these are not available at this practice. Will plan to start Ozempic 0.25mg  for 4 weeks with increased taper. Education on diet, activity, and medication use with demonstration provided today. Will plan to follow-up in 12 weeks to see how she is doing.

## 2021-03-12 NOTE — Assessment & Plan Note (Signed)
BP significantly improved with amlodipine 5mg .  Recommend continued medication use along with reduced diet in saturated fat and sodium to help keep BP under control.  No alarm symptoms present today.  Will plan to follow-up in 12 weeks.

## 2021-03-12 NOTE — Patient Instructions (Addendum)
We will start a medication called Ozempic. I have sent this to the pharmacy in this building as it is out of stock at [facility].   This is a once a week medication. You will inject into your abdomen or thigh. Stay at least 2 inches away from your belly button if you inject into the abdomen.   You should notice you feel fuller longer and dont have cravings. Be sure that you are eating at least three small meals a day to keep your energy levels up.   We will follow-up in 4 weeks with a virtual visit just to check and see how you are doing.   Call me with any questions or if you have nausea that isn't going away.

## 2021-03-12 NOTE — Assessment & Plan Note (Signed)
A1c 6.0% at recent check.  She is not on any medications at this time.  In setting of HTN and elevated BMI, we will plan to begin Ozempic today for blood sugar and CV health. Patient educated on medication use and SE.  We will plan to f/u in 12 weeks to see how she is doing.

## 2021-04-14 ENCOUNTER — Encounter (HOSPITAL_BASED_OUTPATIENT_CLINIC_OR_DEPARTMENT_OTHER): Payer: Self-pay | Admitting: Nurse Practitioner

## 2021-04-14 ENCOUNTER — Other Ambulatory Visit (HOSPITAL_BASED_OUTPATIENT_CLINIC_OR_DEPARTMENT_OTHER): Payer: Self-pay

## 2021-04-14 ENCOUNTER — Telehealth (INDEPENDENT_AMBULATORY_CARE_PROVIDER_SITE_OTHER): Payer: BC Managed Care – PPO | Admitting: Nurse Practitioner

## 2021-04-14 ENCOUNTER — Other Ambulatory Visit: Payer: Self-pay

## 2021-04-14 VITALS — Ht 69.0 in | Wt 243.0 lb

## 2021-04-14 DIAGNOSIS — I10 Essential (primary) hypertension: Secondary | ICD-10-CM | POA: Diagnosis not present

## 2021-04-14 DIAGNOSIS — Z6837 Body mass index (BMI) 37.0-37.9, adult: Secondary | ICD-10-CM

## 2021-04-14 DIAGNOSIS — R7303 Prediabetes: Secondary | ICD-10-CM | POA: Diagnosis not present

## 2021-04-14 MED ORDER — SEMAGLUTIDE (1 MG/DOSE) 4 MG/3ML ~~LOC~~ SOPN
1.0000 mg | PEN_INJECTOR | SUBCUTANEOUS | 3 refills | Status: DC
  Filled 2021-04-14: qty 3, 28d supply, fill #0
  Filled 2021-05-22: qty 3, 28d supply, fill #1
  Filled 2021-06-22: qty 3, 28d supply, fill #2

## 2021-04-14 MED ORDER — AMLODIPINE BESYLATE 5 MG PO TABS
5.0000 mg | ORAL_TABLET | Freq: Every day | ORAL | 3 refills | Status: DC
  Filled 2021-04-14: qty 30, 30d supply, fill #0
  Filled 2021-05-22: qty 30, 30d supply, fill #1
  Filled 2021-09-29: qty 30, 30d supply, fill #2
  Filled 2022-01-15: qty 30, 30d supply, fill #3
  Filled 2022-04-02: qty 30, 30d supply, fill #4

## 2021-04-14 NOTE — Progress Notes (Signed)
Virtual Visit Encounter mychart visit.   I connected with  Crista Curb on 04/17/21 at  2:50 PM EST by secure audio and/or video enabled telemedicine application. I verified that I am speaking with the correct person using two identifiers.   I introduced myself as a Publishing rights manager with the practice. The limitations of evaluation and management by telemedicine discussed with the patient and the availability of in person appointments. The patient expressed verbal understanding and consent to proceed.  Participating parties in this visit include: Myself and patient  The patient is: Patient Location: Home I am: Provider Location: Office/Clinic Subjective:    CC and HPI: LEYTON BROWNLEE is a 51 y.o. year old female presenting for follow up of weight. She tells me she has lost 14lbs so far. She is not having food cravings as she was before, her blood pressure has been in the 120's/80's, and she has increased energy. She reports she is tolerating the medication well with no side effects. She is active and monitoring her diet.   Past medical history, Surgical history, Family history not pertinant except as noted below, Social history, Allergies, and medications have been entered into the medical record, reviewed, and corrections made.   Review of Systems:  All review of systems negative except what is listed in the HPI  Objective:    Alert and oriented x 4 Speaking in clear sentences with no shortness of breath. No distress.  Impression and Recommendations:    Problem List Items Addressed This Visit     Primary hypertension   Relevant Medications   Semaglutide, 1 MG/DOSE, 4 MG/3ML SOPN   amLODipine (NORVASC) 5 MG tablet   BMI 37.0-37.9, adult - Primary   Relevant Medications   Semaglutide, 1 MG/DOSE, 4 MG/3ML SOPN   amLODipine (NORVASC) 5 MG tablet   Pre-diabetes   Relevant Medications   Semaglutide, 1 MG/DOSE, 4 MG/3ML SOPN   amLODipine (NORVASC) 5 MG tablet   Fantastic  response to semaglutide!! She is doing very well and tolerating exceptionally. We will plan to continue the medication and monitor. Recommend exercise and monitoring diet to help maximize effectiveness.   current treatment plan is effective, no change in therapy, orders and follow up as documented in EMR I discussed the assessment and treatment plan with the patient. The patient was provided an opportunity to ask questions and all were answered. The patient agreed with the plan and demonstrated an understanding of the instructions.   The patient was advised to call back or seek an in-person evaluation if the symptoms worsen or if the condition fails to improve as anticipated.  Follow-Up: in 3 months  I provided 15 minutes of non-face-to-face interaction with this non face-to-face encounter including intake, same-day documentation, and chart review.   Tollie Eth, NP , DNP, AGNP-c [facility] Primary Care & Sports Medicine at [facility] [phone] (fax)

## 2021-05-07 ENCOUNTER — Encounter (HOSPITAL_BASED_OUTPATIENT_CLINIC_OR_DEPARTMENT_OTHER): Payer: Self-pay | Admitting: Family Medicine

## 2021-05-07 ENCOUNTER — Ambulatory Visit (INDEPENDENT_AMBULATORY_CARE_PROVIDER_SITE_OTHER): Payer: BC Managed Care – PPO | Admitting: Family Medicine

## 2021-05-07 ENCOUNTER — Other Ambulatory Visit: Payer: Self-pay

## 2021-05-07 DIAGNOSIS — M25462 Effusion, left knee: Secondary | ICD-10-CM

## 2021-05-07 NOTE — Progress Notes (Signed)
° ° °  Procedures performed today:    None.  Independent interpretation of notes and tests performed by another provider:   None.  Brief History, Exam, Impression, and Recommendations:    BP 124/84    Pulse 66    Ht 5\' 9"  (1.753 m)    Wt 244 lb (110.7 kg)    SpO2 98%    BMI 36.03 kg/m   Swelling of joint of left knee Julie Bowers is a 51 year old female presenting for evaluation of left knee swelling She reports that she noticed the swelling today Denies any recent injury, no inciting event this morning Has had history of left knee pain in the past, had prior x-rays in 2011 which showed early degenerative changes.  Has not had any procedural interventions on left knee.  Has had a right knee steroid injection in the past. No swelling this morning, mild pain, knee has felt unstable at times due to the swelling  On exam, Left knee: No obvious deformity. Moderate effusion -physical to fully appreciate due to body habitus Positive patellar grind.  Positive crepitus. Full ROM for flexion and extension.  Strength 5 out of 5 for flexion and extension. Anterior drawer: Negative Posterior drawer: Negative Lachman: Negative Varus stress test: Negative Valgus stress test: Negative McMurray's: Negative Neurovascularly intact.  No evidence of lymphatic disease.  Feel that current symptoms are most likely related to arthritis which has progressed since prior imaging Discussed that degenerative changes can lead to joint pain and swelling at times Recommend proceeding with imaging of the left knee Can continue with conservative measures including OTC medications, elevation, icing If significant abnormality observed on x-rays, manage accordingly If advancing degenerative changes observed, can consider ultrasound-guided aspiration and steroid injection in the office at patient's earliest convenience if symptoms are persistent  Plan for follow-up with me as needed, can arrange for knee  aspiration/injection pending results of imaging   ___________________________________________ Leonor Darnell de 2012, MD, ABFM, CAQSM Primary Care and Sports Medicine [facility]

## 2021-05-07 NOTE — Assessment & Plan Note (Signed)
Julie Bowers is a 51 year old female presenting for evaluation of left knee swelling She reports that she noticed the swelling today Denies any recent injury, no inciting event this morning Has had history of left knee pain in the past, had prior x-rays in 2011 which showed early degenerative changes.  Has not had any procedural interventions on left knee.  Has had a right knee steroid injection in the past. No swelling this morning, mild pain, knee has felt unstable at times due to the swelling  On exam, Left knee: No obvious deformity. Moderate effusion -physical to fully appreciate due to body habitus Positive patellar grind.  Positive crepitus. Full ROM for flexion and extension.  Strength 5 out of 5 for flexion and extension. Anterior drawer: Negative Posterior drawer: Negative Lachman: Negative Varus stress test: Negative Valgus stress test: Negative McMurray's: Negative Neurovascularly intact.  No evidence of lymphatic disease.  Feel that current symptoms are most likely related to arthritis which has progressed since prior imaging Discussed that degenerative changes can lead to joint pain and swelling at times Recommend proceeding with imaging of the left knee Can continue with conservative measures including OTC medications, elevation, icing If significant abnormality observed on x-rays, manage accordingly If advancing degenerative changes observed, can consider ultrasound-guided aspiration and steroid injection in the office at patient's earliest convenience if symptoms are persistent

## 2021-05-07 NOTE — Patient Instructions (Addendum)
°  Medication Instructions:  Your physician recommends that you continue on your current medications as directed. Please refer to the Current Medication list given to you today. --If you need a refill on any your medications before your next appointment, please call your pharmacy first. If no refills are authorized on file call the office.--  Referrals/Procedures/Imaging: Your physician recommends that you have an XRAY of your Left knee. X-rays are a type of radiation called electromagnetic waves. X-ray imaging creates pictures of the inside of your body. The images show the parts of your body in different shades of black and white. This is because different tissues absorb different amounts of radiation. Calcium in bones absorbs x-rays the most, so bones look white. Fat and other soft tissues absorb less and look gray. Air absorbs the least, so lungs look black     1. You may have this done at the [facility], located in the [facility] Building on the 1st floor.    2. You do no have to have an appointment.    3. [address] [place], [place] [address]        [phone]        Monday - Friday  8:00 am - 5:00 pm 4. [facility]  [facility]. [facility] is a premier diagnostic imaging center that offers a 64-slice CT scanner and interventional radiology services.   Follow-Up: Your next appointment:   Your physician recommends that you schedule a follow-up appointment in: as needed with Dr. de [place]  You will receive a text message or e-mail with a link to a survey about your care and experience with [place] today! We would greatly appreciate your feedback!   Thanks for letting us be apart of your health journey!!  Primary Care and Sports Medicine   Dr. Ceasar Mons [place]   We encourage you to activate your patient portal called "MyChart".  Sign up information is provided on this After  Visit Summary.  MyChart is used to connect with patients for Virtual Visits (Telemedicine).  Patients are able to view lab/test results, encounter notes, upcoming appointments, etc.  Non-urgent messages can be sent to your provider as well. To learn more about what you can do with MyChart, please visit --  [email or web address].

## 2021-05-22 ENCOUNTER — Other Ambulatory Visit (HOSPITAL_BASED_OUTPATIENT_CLINIC_OR_DEPARTMENT_OTHER): Payer: Self-pay

## 2021-06-22 ENCOUNTER — Other Ambulatory Visit (HOSPITAL_BASED_OUTPATIENT_CLINIC_OR_DEPARTMENT_OTHER): Payer: Self-pay

## 2021-07-13 ENCOUNTER — Other Ambulatory Visit (HOSPITAL_BASED_OUTPATIENT_CLINIC_OR_DEPARTMENT_OTHER): Payer: Self-pay

## 2021-07-13 ENCOUNTER — Ambulatory Visit (INDEPENDENT_AMBULATORY_CARE_PROVIDER_SITE_OTHER): Payer: BC Managed Care – PPO | Admitting: Nurse Practitioner

## 2021-07-13 ENCOUNTER — Encounter (HOSPITAL_BASED_OUTPATIENT_CLINIC_OR_DEPARTMENT_OTHER): Payer: Self-pay | Admitting: Nurse Practitioner

## 2021-07-13 DIAGNOSIS — R7303 Prediabetes: Secondary | ICD-10-CM

## 2021-07-13 DIAGNOSIS — Z6837 Body mass index (BMI) 37.0-37.9, adult: Secondary | ICD-10-CM

## 2021-07-13 DIAGNOSIS — I10 Essential (primary) hypertension: Secondary | ICD-10-CM | POA: Diagnosis not present

## 2021-07-13 MED ORDER — SEMAGLUTIDE (2 MG/DOSE) 8 MG/3ML ~~LOC~~ SOPN
2.0000 mg | PEN_INJECTOR | SUBCUTANEOUS | 6 refills | Status: DC
  Filled 2021-07-13: qty 3, 28d supply, fill #0
  Filled 2021-08-10: qty 3, 28d supply, fill #1
  Filled 2021-09-29: qty 3, 28d supply, fill #2
  Filled 2021-10-26: qty 3, 28d supply, fill #3
  Filled 2021-12-11: qty 3, 28d supply, fill #4
  Filled 2022-01-15: qty 3, 28d supply, fill #5
  Filled 2022-04-02: qty 3, 28d supply, fill #6

## 2021-07-13 NOTE — Assessment & Plan Note (Signed)
Doing great on oxempic. No concerning symptoms present today. She has had significant weight loss and is feeling good. Her current dose is becoming less effective for cravings, will plan to increase dose to 2mg  after she completes her next injection and monitor.  ?Patient advised to contact me if she is having side effects with the increased dose. Will plan to monitor labs in 6 months with follow-up visit or sooner if needed.  ?

## 2021-07-13 NOTE — Assessment & Plan Note (Signed)
Weight loss greater than 25 pounds. She is going to weigh herself when she gets home to report her weight loss total.  ?She is eating better and exercising more.  ?Plan to continue semaglutide at this time. Will plan to increase the dose to the 2mg  as she is having some decline in appetite suppression.  ?Plan to f/u in 6 months.  ?

## 2021-07-13 NOTE — Progress Notes (Signed)
Virtual Visit Encounter telephone visit. ? ? ?I connected with  Julie Bowers on 07/13/21 at  3:10 PM EDT by secure audio telemedicine application. I verified that I am speaking with the correct person using two identifiers. ?  ?I introduced myself as a Designer, jewellery with the practice. The limitations of evaluation and management by telemedicine discussed with the patient and the availability of in person appointments. The patient expressed verbal understanding and consent to proceed. ? ?Participating parties in this visit include: Myself and patient ? ?The patient is: Patient Location: Other:  work ?I am: Provider Location: Office/Clinic ?Subjective:   ? ?CC and HPI: Julie Bowers is a 51 y.o. year old female presenting for follow up of weight management. ?She reports that she is doing great with her weight loss. She is currently on the 1mg  of Ozempic. She has lost more than 25 pounds. She has not had any nausea, vomiting, or abdominal pain. She feels like her energy is not as good as she would like, but she endorses she wakes at 415 in the morning and does not sleep enough. She reports that her cravings have been low, but the cravings for sweets have come back a bit.  ?She is working on diet and exercise. She does not have any way to check her BP, but denies CP, ShOB, palpitations, dizziness, HA.  ? ?Past medical history, Surgical history, Family history not pertinant except as noted below, Social history, Allergies, and medications have been entered into the medical record, reviewed, and corrections made.  ? ?Review of Systems:  ?All review of systems negative except what is listed in the HPI ? ?Objective:   ? ?Alert and oriented x 4 ?Speaking in clear sentences with no shortness of breath. ?No distress. ? ?Impression and Recommendations:   ? ?Problem List Items Addressed This Visit   ? ? Primary hypertension  ?  Unable to check BP today- no alarm symptoms at this time.  ?Recommend continue with  current medication and dose. Will plan for f/u in 6 months and monitor labs at that time.  ?  ?  ? Relevant Medications  ? Semaglutide, 2 MG/DOSE, 8 MG/3ML SOPN  ? BMI 37.0-37.9, adult  ?  Weight loss greater than 25 pounds. She is going to weigh herself when she gets home to report her weight loss total.  ?She is eating better and exercising more.  ?Plan to continue semaglutide at this time. Will plan to increase the dose to the 2mg  as she is having some decline in appetite suppression.  ?Plan to f/u in 6 months.  ?  ?  ? Relevant Medications  ? Semaglutide, 2 MG/DOSE, 8 MG/3ML SOPN  ? Pre-diabetes - Primary  ?  Doing great on oxempic. No concerning symptoms present today. She has had significant weight loss and is feeling good. Her current dose is becoming less effective for cravings, will plan to increase dose to 2mg  after she completes her next injection and monitor.  ?Patient advised to contact me if she is having side effects with the increased dose. Will plan to monitor labs in 6 months with follow-up visit or sooner if needed.  ?  ?  ? Relevant Medications  ? Semaglutide, 2 MG/DOSE, 8 MG/3ML SOPN  ? ? ?orders and follow up as documented in EMR ?I discussed the assessment and treatment plan with the patient. The patient was provided an opportunity to ask questions and all were answered. The patient agreed with the plan  and demonstrated an understanding of the instructions. ?  ?The patient was advised to call back or seek an in-person evaluation if the symptoms worsen or if the condition fails to improve as anticipated. ? ?Follow-Up: in 6 months ? ?I provided 15 minutes of non-face-to-face interaction with this non face-to-face encounter including intake, same-day documentation, and chart review.  ? ?Orma Render, NP , DNP, AGNP-c ?[facility] ?Primary Care & Sports Medicine at [facility] ?[phone] ?[phone] (fax) ? ?

## 2021-07-13 NOTE — Assessment & Plan Note (Signed)
Unable to check BP today- no alarm symptoms at this time.  ?Recommend continue with current medication and dose. Will plan for f/u in 6 months and monitor labs at that time.  ?

## 2021-08-10 ENCOUNTER — Other Ambulatory Visit (HOSPITAL_BASED_OUTPATIENT_CLINIC_OR_DEPARTMENT_OTHER): Payer: Self-pay

## 2021-09-29 ENCOUNTER — Other Ambulatory Visit (HOSPITAL_BASED_OUTPATIENT_CLINIC_OR_DEPARTMENT_OTHER): Payer: Self-pay

## 2021-10-26 ENCOUNTER — Other Ambulatory Visit (HOSPITAL_BASED_OUTPATIENT_CLINIC_OR_DEPARTMENT_OTHER): Payer: Self-pay

## 2021-12-11 ENCOUNTER — Other Ambulatory Visit (HOSPITAL_BASED_OUTPATIENT_CLINIC_OR_DEPARTMENT_OTHER): Payer: Self-pay

## 2022-01-15 ENCOUNTER — Other Ambulatory Visit (HOSPITAL_BASED_OUTPATIENT_CLINIC_OR_DEPARTMENT_OTHER): Payer: Self-pay

## 2022-03-14 ENCOUNTER — Ambulatory Visit (HOSPITAL_COMMUNITY)
Admission: EM | Admit: 2022-03-14 | Discharge: 2022-03-14 | Disposition: A | Discharge status: Home / Self Care | Payer: BC Managed Care – PPO | Attending: Family Medicine | Admitting: Family Medicine

## 2022-03-14 ENCOUNTER — Encounter (HOSPITAL_COMMUNITY): Payer: Self-pay

## 2022-03-14 DIAGNOSIS — M549 Dorsalgia, unspecified: Secondary | ICD-10-CM | POA: Diagnosis not present

## 2022-03-14 MED ORDER — KETOROLAC TROMETHAMINE 60 MG/2ML IM SOLN
30.0000 mg | Freq: Once | INTRAMUSCULAR | Status: AC
  Administered 2022-03-14: 30 mg via INTRAMUSCULAR

## 2022-03-14 MED ORDER — LIDOCAINE 5 % EX PTCH: 1.0000 | MEDICATED_PATCH | CUTANEOUS | 0 refills | Status: DC

## 2022-03-14 MED ORDER — KETOROLAC TROMETHAMINE 30 MG/ML IJ SOLN: 30.0000 mg | Freq: Once | INTRAMUSCULAR | Status: DC

## 2022-03-14 MED ORDER — KETOROLAC TROMETHAMINE 60 MG/2ML IM SOLN
INTRAMUSCULAR | Status: AC
  Filled 2022-03-14: qty 2

## 2022-03-14 MED ORDER — CYCLOBENZAPRINE HCL 10 MG PO TABS: 10.0000 mg | ORAL_TABLET | Freq: Two times a day (BID) | ORAL | 0 refills | Status: DC | PRN

## 2022-03-14 NOTE — ED Triage Notes (Signed)
Chief Complaint: Back spasms. No falls or recent injuries. No new activities or heavy lifting. Patient has history of back spasms.    Onset: last night, random onset.   Prescriptions or OTC medications tried: Yes- ibuprofen    with no relief

## 2022-03-14 NOTE — ED Provider Notes (Signed)
[facility]    CSN: [id number] Arrival date & time: 03/14/22  1133      History   Chief Complaint Chief Complaint  Patient presents with   Back Pain    HPI Julie Bowers is a 51 y.o. female.   Patient is here for back pain/spasms that started last night.  Pain at the right/mid back, close to her spine, at around the bra strap.  No pain, numbness or tingling down the leg.  No injury or heavy lifting.  No urinary symptoms noted.  She did take motrin without any help.  No fevers/chills.  Has had this in the past as well.  She is using a topical patch at this time.        Past Medical History:  Diagnosis Date   Anemia    Hypertension     Patient Active Problem List   Diagnosis Date Noted   Swelling of joint of left knee 05/07/2021   BMI 37.0-37.9, adult 03/12/2021   Pre-diabetes 03/12/2021   Primary hypertension 02/13/2021   Encounter for medical examination to establish care 02/13/2021   Anemia 03/03/2020   Right knee pain 11/23/2019   Menorrhagia 09/28/2019    Past Surgical History:  Procedure Laterality Date   FRACTURE SURGERY      OB History   No obstetric history on file.      Home Medications    Prior to Admission medications   Medication Sig Start Date End Date Taking? Authorizing Provider  amLODipine (NORVASC) 5 MG tablet Take 1 tablet (5 mg total) by mouth daily. 04/14/21  Yes Early, Sung Amabile, NP  Semaglutide, 2 MG/DOSE, 8 MG/3ML SOPN Inject 2 mg as directed once a week. 07/13/21  Yes Early, Sung Amabile, NP  Blood Pressure Monitoring (BLOOD PRESSURE CUFF) MISC Electronic blood pressure cuff as covered by insurance for ICD-10 I10 02/12/21   Early, Sung Amabile, NP  chlorhexidine (PERIDEX) 0.12 % solution 15 mLs 2 (two) times daily. 10/29/20   [provider]  Insulin Pen Needle (PEN NEEDLES) 32G X 4 MM MISC Use one new needle per week with injection of Ozempic 03/12/21   Early, Sung Amabile, NP    Family History Family History  Problem  Relation Age of Onset   Anemia Mother    Stomach cancer Mother    Heart disease Father    Anemia Sister    Stomach cancer Brother     Social History Social History   Tobacco Use   Smoking status: Former    Types: Cigarettes    Quit date: 2015    Years since quitting: 8.9   Smokeless tobacco: Never   Tobacco comments:    1 pack per week   Vaping Use   Vaping Use: Never used  Substance Use Topics   Alcohol use: Yes    Comment: occasional    Drug use: No     Allergies   Patient has no known allergies.   Review of Systems Review of Systems  Constitutional: Negative.   HENT: Negative.    Respiratory: Negative.    Cardiovascular: Negative.   Gastrointestinal: Negative.   Musculoskeletal:  Positive for back pain.  Psychiatric/Behavioral: Negative.       Physical Exam Triage Vital Signs ED Triage Vitals [03/14/22 1253]  Enc Vitals Group     BP 137/83     Pulse Rate 84     Resp 16     Temp 98.3 F (36.8 C)  Temp Source Oral     SpO2 98 %     Weight      Height      Head Circumference      Peak Flow      Pain Score 9     Pain Loc      Pain Edu?      Excl. in GC?    No data found.  Updated Vital Signs BP 137/83 (BP Location: Right Arm)   Pulse 84   Temp 98.3 F (36.8 C) (Oral)   Resp 16   SpO2 98%   Visual Acuity Right Eye Distance:   Left Eye Distance:   Bilateral Distance:    Right Eye Near:   Left Eye Near:    Bilateral Near:     Physical Exam Constitutional:      Appearance: Normal appearance.  Cardiovascular:     Rate and Rhythm: Normal rate and regular rhythm.  Pulmonary:     Effort: Pulmonary effort is normal.     Breath sounds: Normal breath sounds.  Musculoskeletal:     Comments: TTP to the mid right back, just lateral to the spine to light palpation;  muscle is tense and tight;  decreased ROM due to pain  Skin:    General: Skin is warm.  Neurological:     General: No focal deficit present.     Mental Status: She is  alert.  Psychiatric:        Mood and Affect: Mood normal.      UC Treatments / Results  Labs (all labs ordered are listed, but only abnormal results are displayed) Labs Reviewed - No data to display  EKG   Radiology No results found.  Procedures Procedures (including critical care time)  Medications Ordered in UC Medications  ketorolac (TORADOL) 30 MG/ML injection 30 mg (has no administration in time range)    Initial Impression / Assessment and Plan / UC Course  I have reviewed the triage vital signs and the nursing notes.  Pertinent labs & imaging results that were available during my care of the patient were reviewed by me and considered in my medical decision making (see chart for details).   Final Clinical Impressions(s) / UC Diagnoses   Final diagnoses:  Mid back pain on right side     Discharge Instructions      You were seen today for back pain/spasm.  I have given you a shot of torodol today to help with pain.  I have sent out a muscle relaxer and lidoderm patch to your pharmacy to help with pain.  You may use heat and ice as well.  Please return or follow up with your primary care provider if not improving.     ED Prescriptions     Medication Sig Dispense Auth. Provider   cyclobenzaprine (FLEXERIL) 10 MG tablet Take 1 tablet (10 mg total) by mouth 2 (two) times daily as needed for muscle spasms. 20 tablet Chanel Mcadams, MD   lidocaine (LIDODERM) 5 % Place 1 patch onto the skin daily. Remove & Discard patch within 12 hours or as directed by MD 30 patch Cherylin Waguespack, MD      PDMP not reviewed this encounter.   Jannifer Franklin, MD 03/14/22 1321

## 2022-03-14 NOTE — Discharge Instructions (Addendum)
You were seen today for back pain/spasm.  I have given you a shot of torodol today to help with pain.  I have sent out a muscle relaxer and lidoderm patch to your pharmacy to help with pain.  You may use heat and ice as well.  Please return or follow up with your primary care provider if not improving.

## 2022-04-02 ENCOUNTER — Other Ambulatory Visit (HOSPITAL_BASED_OUTPATIENT_CLINIC_OR_DEPARTMENT_OTHER): Payer: Self-pay

## 2022-04-06 ENCOUNTER — Emergency Department (HOSPITAL_COMMUNITY)
Admission: EM | Admit: 2022-04-06 | Discharge: 2022-04-07 | Discharge status: Against Medical Advice | Payer: BC Managed Care – PPO | Attending: Physician Assistant | Admitting: Physician Assistant

## 2022-04-06 DIAGNOSIS — Z5321 Procedure and treatment not carried out due to patient leaving prior to being seen by health care provider: Secondary | ICD-10-CM | POA: Diagnosis not present

## 2022-04-06 DIAGNOSIS — R1110 Vomiting, unspecified: Secondary | ICD-10-CM | POA: Insufficient documentation

## 2022-04-06 LAB — CBC WITH DIFFERENTIAL/PLATELET
Abs Immature Granulocytes: 0.09 10*3/uL — ABNORMAL HIGH (ref 0.00–0.07)
Basophils Absolute: 0.1 10*3/uL (ref 0.0–0.1)
Basophils Relative: 1 %
Eosinophils Absolute: 0 10*3/uL (ref 0.0–0.5)
Eosinophils Relative: 0 %
HCT: 46 % (ref 36.0–46.0)
Hemoglobin: 15.2 g/dL — ABNORMAL HIGH (ref 12.0–15.0)
Immature Granulocytes: 1 %
Lymphocytes Relative: 12 %
Lymphs Abs: 1.2 10*3/uL (ref 0.7–4.0)
MCH: 30.8 pg (ref 26.0–34.0)
MCHC: 33 g/dL (ref 30.0–36.0)
MCV: 93.3 fL (ref 80.0–100.0)
Monocytes Absolute: 0.9 10*3/uL (ref 0.1–1.0)
Monocytes Relative: 9 %
Neutro Abs: 7.6 10*3/uL (ref 1.7–7.7)
Neutrophils Relative %: 77 %
Platelets: 360 10*3/uL (ref 150–400)
RBC: 4.93 MIL/uL (ref 3.87–5.11)
RDW: 12.9 % (ref 11.5–15.5)
WBC: 9.9 10*3/uL (ref 4.0–10.5)
nRBC: 0 % (ref 0.0–0.2)

## 2022-04-06 LAB — COMPREHENSIVE METABOLIC PANEL
ALT: 23 U/L (ref 0–44)
AST: 29 U/L (ref 15–41)
Albumin: 4.5 g/dL (ref 3.5–5.0)
Alkaline Phosphatase: 78 U/L (ref 38–126)
Anion gap: 13 (ref 5–15)
BUN: 11 mg/dL (ref 6–20)
CO2: 23 mmol/L (ref 22–32)
Calcium: 9.9 mg/dL (ref 8.9–10.3)
Chloride: 101 mmol/L (ref 98–111)
Creatinine, Ser: 0.96 mg/dL (ref 0.44–1.00)
GFR, Estimated: >60 mL/min (ref >60)
Glucose, Bld: 106 mg/dL — ABNORMAL HIGH (ref 70–99)
Potassium: 3.7 mmol/L (ref 3.5–5.1)
Sodium: 137 mmol/L (ref 135–145)
Total Bilirubin: 1.2 mg/dL (ref 0.3–1.2)
Total Protein: 8.4 g/dL — ABNORMAL HIGH (ref 6.5–8.1)

## 2022-04-06 LAB — LIPASE, BLOOD: Lipase: 44 U/L (ref 11–51)

## 2022-04-06 MED ORDER — SODIUM CHLORIDE 0.9 % IV BOLUS: 1000.0000 mL | Freq: Once | INTRAVENOUS | Status: DC

## 2022-04-06 MED ORDER — ONDANSETRON 4 MG PO TBDP
4.0000 mg | ORAL_TABLET | Freq: Once | ORAL | Status: AC
  Administered 2022-04-06: 4 mg via ORAL
  Filled 2022-04-06: qty 1

## 2022-04-06 NOTE — ED Provider Triage Note (Signed)
Emergency Medicine Provider Triage Evaluation Note  Julie Bowers , a 52 y.o. female  was evaluated in triage.  Pt complains of vomiting after taking ozempic   Review of Systems  Positive: Vomiting  Negative: fever  Physical Exam  BP (!) 165/128 (BP Location: Left Arm)   Pulse (!) 116   Temp 98.5 F (36.9 C) (Oral)   Resp 18   SpO2 96%  Gen:   Awake,  pt vomiting Resp:  Normal effort  MSK:   Moves extremities without difficulty  Other:    Medical Decision Making  Medically screening exam initiated at 7:46 PM.  Appropriate orders placed.  Julie Bowers was informed that the remainder of the evaluation will be completed by another provider, this initial triage assessment does not replace that evaluation, and the importance of remaining in the ED until their evaluation is complete.     Fransico Meadow, [place] 04/06/22 1952

## 2022-04-06 NOTE — ED Triage Notes (Addendum)
Pt reports that she took a 2mg  dose of Ozempic last night. She was previously on that dose, but has not had it in a while. She has been vomiting since. Also reports severe headache and hand cramping.

## 2022-04-14 ENCOUNTER — Encounter: Payer: Self-pay | Admitting: Internal Medicine

## 2022-04-25 ENCOUNTER — Other Ambulatory Visit (HOSPITAL_BASED_OUTPATIENT_CLINIC_OR_DEPARTMENT_OTHER): Payer: Self-pay | Admitting: Nurse Practitioner

## 2022-04-25 DIAGNOSIS — I10 Essential (primary) hypertension: Secondary | ICD-10-CM

## 2022-04-25 DIAGNOSIS — R7303 Prediabetes: Secondary | ICD-10-CM

## 2022-04-25 DIAGNOSIS — Z6837 Body mass index (BMI) 37.0-37.9, adult: Secondary | ICD-10-CM

## 2022-04-26 ENCOUNTER — Other Ambulatory Visit: Payer: Self-pay | Admitting: Nurse Practitioner

## 2022-04-26 ENCOUNTER — Other Ambulatory Visit (HOSPITAL_BASED_OUTPATIENT_CLINIC_OR_DEPARTMENT_OTHER): Payer: Self-pay

## 2022-04-26 DIAGNOSIS — Z6837 Body mass index (BMI) 37.0-37.9, adult: Secondary | ICD-10-CM

## 2022-04-26 DIAGNOSIS — R7303 Prediabetes: Secondary | ICD-10-CM

## 2022-04-26 DIAGNOSIS — I10 Essential (primary) hypertension: Secondary | ICD-10-CM

## 2022-04-26 MED ORDER — AMLODIPINE BESYLATE 5 MG PO TABS
5.0000 mg | ORAL_TABLET | Freq: Every day | ORAL | 3 refills | Status: DC
  Filled 2022-04-26 – 2022-11-17 (×2): qty 30, 30d supply, fill #0

## 2022-05-06 ENCOUNTER — Other Ambulatory Visit (HOSPITAL_BASED_OUTPATIENT_CLINIC_OR_DEPARTMENT_OTHER): Payer: Self-pay

## 2022-06-23 ENCOUNTER — Other Ambulatory Visit (HOSPITAL_BASED_OUTPATIENT_CLINIC_OR_DEPARTMENT_OTHER): Payer: Self-pay

## 2022-06-23 ENCOUNTER — Other Ambulatory Visit: Payer: Self-pay

## 2022-06-23 ENCOUNTER — Emergency Department (HOSPITAL_BASED_OUTPATIENT_CLINIC_OR_DEPARTMENT_OTHER)
Admission: EM | Admit: 2022-06-23 | Discharge: 2022-06-23 | Disposition: A | Discharge status: Home / Self Care | Payer: BC Managed Care – PPO | Attending: Emergency Medicine | Admitting: Emergency Medicine

## 2022-06-23 ENCOUNTER — Encounter (HOSPITAL_BASED_OUTPATIENT_CLINIC_OR_DEPARTMENT_OTHER): Payer: Self-pay | Admitting: Emergency Medicine

## 2022-06-23 ENCOUNTER — Emergency Department (HOSPITAL_BASED_OUTPATIENT_CLINIC_OR_DEPARTMENT_OTHER): Payer: BC Managed Care – PPO | Admitting: Radiology

## 2022-06-23 DIAGNOSIS — R0789 Other chest pain: Secondary | ICD-10-CM | POA: Diagnosis not present

## 2022-06-23 DIAGNOSIS — R079 Chest pain, unspecified: Secondary | ICD-10-CM

## 2022-06-23 LAB — TROPONIN I (HIGH SENSITIVITY): Troponin I (High Sensitivity): 4 ng/L (ref <18)

## 2022-06-23 LAB — CBC
HCT: 43.6 % (ref 36.0–46.0)
Hemoglobin: 13.9 g/dL (ref 12.0–15.0)
MCH: 30.7 pg (ref 26.0–34.0)
MCHC: 31.9 g/dL (ref 30.0–36.0)
MCV: 96.2 fL (ref 80.0–100.0)
Platelets: 386 10*3/uL (ref 150–400)
RBC: 4.53 MIL/uL (ref 3.87–5.11)
RDW: 13.1 % (ref 11.5–15.5)
WBC: 6.7 10*3/uL (ref 4.0–10.5)
nRBC: 0 % (ref 0.0–0.2)

## 2022-06-23 LAB — BASIC METABOLIC PANEL
Anion gap: 8 (ref 5–15)
BUN: 16 mg/dL (ref 6–20)
CO2: 27 mmol/L (ref 22–32)
Calcium: 9.6 mg/dL (ref 8.9–10.3)
Chloride: 103 mmol/L (ref 98–111)
Creatinine, Ser: 0.77 mg/dL (ref 0.44–1.00)
GFR, Estimated: >60 mL/min (ref >60)
Glucose, Bld: 94 mg/dL (ref 70–99)
Potassium: 4 mmol/L (ref 3.5–5.1)
Sodium: 138 mmol/L (ref 135–145)

## 2022-06-23 LAB — PREGNANCY, URINE: Preg Test, Ur: NEGATIVE

## 2022-06-23 MED ORDER — KETOROLAC TROMETHAMINE 30 MG/ML IJ SOLN
30.0000 mg | Freq: Once | INTRAMUSCULAR | Status: AC
  Administered 2022-06-23: 30 mg via INTRAVENOUS
  Filled 2022-06-23: qty 1

## 2022-06-23 MED ORDER — ALUM & MAG HYDROXIDE-SIMETH 200-200-20 MG/5ML PO SUSP
30.0000 mL | Freq: Once | ORAL | Status: AC
  Administered 2022-06-23: 30 mL via ORAL
  Filled 2022-06-23: qty 30

## 2022-06-23 MED ORDER — OMEPRAZOLE 20 MG PO CPDR
20.0000 mg | DELAYED_RELEASE_CAPSULE | Freq: Every day | ORAL | 0 refills | Status: DC
  Filled 2022-06-23: qty 30, 30d supply, fill #0

## 2022-06-23 NOTE — ED Provider Notes (Signed)
[facility] Provider Note   CSN: [id number] Arrival date & time: 06/23/22  [id number]     History  Chief Complaint  Patient presents with   Chest Pain    Julie Bowers is a 52 y.o. female.  HPI   52 year old female presents to the emergency department with left-sided chest pain.  Patient states when she got to work she had sharp left-sided chest pain.  It is worse with specific movements including deep breaths and movement of the left upper extremity.  Patient states that she has had this 1 other time, was diagnosed with esophageal spasms.  States that she was prescribed a small pill and this improved.  She never follow-up as an outpatient with GI.  At rest patient has no pain.  She denies any shortness of breath, cough, fever, leg swelling, recent illness.  Home Medications Prior to Admission medications   Medication Sig Start Date End Date Taking? Authorizing Provider  amLODipine (NORVASC) 5 MG tablet Take 1 tablet (5 mg total) by mouth daily. 04/26/22   Orma Render, NP  Blood Pressure Monitoring (BLOOD PRESSURE CUFF) MISC Electronic blood pressure cuff as covered by insurance for ICD-10 I10 02/12/21   Early, Coralee Pesa, NP  chlorhexidine (PERIDEX) 0.12 % solution 15 mLs 2 (two) times daily. 10/29/20   [provider]  cyclobenzaprine (FLEXERIL) 10 MG tablet Take 1 tablet (10 mg total) by mouth 2 (two) times daily as needed for muscle spasms. 03/14/22   Piontek, Junie Panning, MD  Insulin Pen Needle (PEN NEEDLES) 32G X 4 MM MISC Use one new needle per week with injection of Ozempic 03/12/21   Early, Coralee Pesa, NP  lidocaine (LIDODERM) 5 % Place 1 patch onto the skin daily. Remove & Discard patch within 12 hours or as directed by MD 03/14/22   Rondel Oh, MD  Semaglutide, 2 MG/DOSE, 8 MG/3ML SOPN Inject 2 mg as directed once a week. 07/13/21   Orma Render, NP      Allergies    Patient has no known allergies.    Review of Systems   Review of  Systems  Constitutional:  Negative for fever.  Respiratory:  Negative for cough, chest tightness and shortness of breath.   Cardiovascular:  Positive for chest pain. Negative for palpitations and leg swelling.  Gastrointestinal:  Negative for abdominal pain, diarrhea and vomiting.  Musculoskeletal:  Negative for back pain.  Skin:  Negative for rash.  Neurological:  Negative for headaches.    Physical Exam Updated Vital Signs BP (!) 164/86 (BP Location: Left Arm)   Pulse 70   Temp 98.7 F (37.1 C) (Oral)   Resp (!) 21   SpO2 99%  Physical Exam Vitals and nursing note reviewed.  Constitutional:      General: She is not in acute distress.    Appearance: Normal appearance.  HENT:     Head: Normocephalic.     Mouth/Throat:     Mouth: Mucous membranes are moist.  Cardiovascular:     Rate and Rhythm: Normal rate.  Pulmonary:     Effort: Pulmonary effort is normal. No tachypnea or respiratory distress.     Breath sounds: No decreased breath sounds.  Abdominal:     Palpations: Abdomen is soft.     Tenderness: There is no abdominal tenderness.  Musculoskeletal:     Right lower leg: No edema.     Left lower leg: No edema.  Skin:    General: Skin  is warm.     Findings: No rash.  Neurological:     Mental Status: She is alert and oriented to person, place, and time. Mental status is at baseline.  Psychiatric:        Mood and Affect: Mood normal.     ED Results / Procedures / Treatments   Labs (all labs ordered are listed, but only abnormal results are displayed) Labs Reviewed  BASIC METABOLIC PANEL  CBC  PREGNANCY, URINE  TROPONIN I (HIGH SENSITIVITY)  TROPONIN I (HIGH SENSITIVITY)    EKG EKG Interpretation  Date/Time:  Wednesday June 23 2022 07:16:19 EDT Ventricular Rate:  68 PR Interval:  140 QRS Duration: 89 QT Interval:  407 QTC Calculation: 433 R Axis:   47 Text Interpretation: Sinus rhythm Confirmed by Lavenia Atlas [phone]) on 06/23/2022 7:20:31  AM  Radiology DG Chest 2 View  Result Date: 06/23/2022 CLINICAL DATA:  Pt reports when walking into work the left side of her chest started hurting. Pt reports pain is worse with deep breathing. Denies n/v. Pt states she experienced the same pain a few yrs ago and was told she had esophageal spasms EXAM: CHEST - 2 VIEW COMPARISON:  11/16/2009 FINDINGS: Low lung volumes with crowding of bronchovascular spur structures. No confluent airspace disease or overt edema. Heart size and mediastinal contours are within normal limits. Aortic Atherosclerosis (ICD10-170.0). No effusion. Visualized bones unremarkable. IMPRESSION: Low volumes. No acute findings. Electronically Signed   By: Lucrezia Europe M.D.   On: 06/23/2022 07:45    Procedures Procedures    Medications Ordered in ED Medications  ketorolac (TORADOL) 30 MG/ML injection 30 mg (30 mg Intravenous Given 06/23/22 0834)  alum & mag hydroxide-simeth (MAALOX/MYLANTA) 200-200-20 MG/5ML suspension 30 mL (30 mLs Oral Given 06/23/22 [id number])    ED Course/ Medical Decision Making/ A&P                             Medical Decision Making Amount and/or Complexity of Data Reviewed Labs: ordered. Radiology: ordered.  Risk OTC drugs. Prescription drug management.   52 year old female presents emergency department left-sided chest pain.  It is sharp, intermittent, usually movement related.  No ongoing discomfort while she is sitting still in bed.  Vitals are stable on arrival.  EKG shows no ischemic changes.  Chest x-ray is unremarkable, blood works reassuring, negative troponin.  No rash/shingles in the area.  Patient meant that she had similar symptoms with esophageal spasm before.  She is given a GI cocktail and Toradol with resolution of her symptoms.  Sounds like she at one point was prescribed a PPI that she discontinued.  Was post to have outpatient GI follow-up and never established.  Doubt ACS, PE or other acute process.  No need for repeat  troponin, this is very atypical.  Will plan for symptomatic treatment and outpatient follow-up.  Patient at this time appears safe and stable for discharge and close outpatient follow up. Discharge plan and strict return to ED precautions discussed, patient verbalizes understanding and agreement.        Final Clinical Impression(s) / ED Diagnoses Final diagnoses:  None    Rx / DC Orders ED Discharge Orders     None         Lorelle Gibbs, DO 06/23/22 1011

## 2022-06-23 NOTE — ED Triage Notes (Signed)
Pt reports when walking into work the left side of her chest started hurting. Pt reports pain is worse with deep breathing. Denies n/v.

## 2022-06-23 NOTE — Discharge Instructions (Signed)
You have been seen and discharged from the emergency department.  Your heart and lung workup was normal.  I believe your chest pain is caused by a noncardiac/pulmonary etiology.  You were given medications in the emergency department with improvement.  Follow-up with your primary provider for further evaluation and further care. Take home medications as prescribed. If you have any worsening symptoms or further concerns for your health please return to an emergency department for further evaluation.

## 2022-10-08 ENCOUNTER — Encounter: Payer: Self-pay | Admitting: Nurse Practitioner

## 2022-10-08 ENCOUNTER — Other Ambulatory Visit (HOSPITAL_BASED_OUTPATIENT_CLINIC_OR_DEPARTMENT_OTHER): Payer: Self-pay

## 2022-10-08 ENCOUNTER — Ambulatory Visit (INDEPENDENT_AMBULATORY_CARE_PROVIDER_SITE_OTHER): Payer: BC Managed Care – PPO | Admitting: Nurse Practitioner

## 2022-10-08 VITALS — BP 150/90 | HR 80 | Ht 69.0 in | Wt 250.0 lb

## 2022-10-08 DIAGNOSIS — E1165 Type 2 diabetes mellitus with hyperglycemia: Secondary | ICD-10-CM | POA: Diagnosis not present

## 2022-10-08 DIAGNOSIS — M25561 Pain in right knee: Secondary | ICD-10-CM

## 2022-10-08 DIAGNOSIS — R7303 Prediabetes: Secondary | ICD-10-CM

## 2022-10-08 MED ORDER — TRAMADOL HCL 50 MG PO TABS
50.0000 mg | ORAL_TABLET | Freq: Three times a day (TID) | ORAL | 0 refills | Status: AC | PRN
  Filled 2022-10-08: qty 15, 5d supply, fill #0

## 2022-10-08 MED ORDER — MELOXICAM 15 MG PO TABS
15.0000 mg | ORAL_TABLET | Freq: Every day | ORAL | 2 refills | Status: DC
  Filled 2022-10-08: qty 30, 30d supply, fill #0

## 2022-10-08 MED ORDER — OZEMPIC (0.25 OR 0.5 MG/DOSE) 2 MG/3ML ~~LOC~~ SOPN
PEN_INJECTOR | SUBCUTANEOUS | 1 refills | Status: AC
  Filled 2022-10-08: qty 1.5, 35d supply, fill #0
  Filled 2022-10-08: qty 3, 34d supply, fill #0
  Filled 2022-11-17: qty 3, 34d supply, fill #1

## 2022-10-08 NOTE — Progress Notes (Signed)
Tollie Eth, DNP, AGNP-c [facility] [address] [place], [place] [address] [phone]   ACUTE VISIT- ESTABLISHED PATIENT  Blood pressure (!) 150/90, pulse 80, height 5\' 9"  (1.753 m), weight 250 lb (113.4 kg), SpO2 97%.  Subjective:  HPI Julie Bowers is a 52 y.o. female presents to day for evaluation of: knee pain.   Right knee pain Julie Bowers presents today with worsening swelling and pain in the right knee extending down to the toes.  She also endorses pain in the heel and calf.  She reports difficulty lifting the leg, getting off the couch, and stepping over objects.  She tells me that the knee occasionally gives out, causing near falls.  She has a history of chronic knee issues with an x-ray in 2011 showing arthritis changes and osteophyte formation.  The current worsening of symptoms has been progressing over the last couple of months.  She reports taking up to 10 ibuprofen tablets daily for pain management.  She has been walking with a cane at home for assistance with mobility.  [name] is currently on Ozempic for diabetes management.  She recently missed 2 weeks of the medication which caused adverse effects upon resuming.  She request to restart the medication at a lower dose.  PMH, Medications, and Allergies reviewed and updated in chart as appropriate.   ROS negative except for what is listed in HPI. Objective:  Physical Exam Vitals and nursing note reviewed.  Cardiovascular:     Rate and Rhythm: Normal rate and regular rhythm.     Pulses: Normal pulses.     Heart sounds: Normal heart sounds.  Pulmonary:     Effort: Pulmonary effort is normal.     Breath sounds: Normal breath sounds.  Musculoskeletal:        General: Swelling and tenderness present.     Comments: Right medial knee tenderness is present.  Positive for crepitus on movement.  Swelling present.  Range of motion is decreased.  No laxity noted within the joint ligaments.  Skin:     General: Skin is warm and dry.     Capillary Refill: Capillary refill takes less than 2 seconds.     Comments: Bilateral hands are cold.  Range of motion and coloration within normal limits.  Psychiatric:        Behavior: Behavior normal.         Assessment & Plan:   Problem List Items Addressed This Visit     Right knee pain - Primary    Patient presents with acute right knee pain and swelling in the setting of chronic arthritic changes. Medial tenderness and swelling is noted with no laxity assessed. Suspected meniscus tear and possible exacerbation of pre-existing arthritis and osteophyte formation. Plan: - Referral to orthopedics for further evaluation and management. - X-ray of the knee to assess for worsening of osteophyte formation or arthritis (optional, pending orthopedic consultation). - Meloxicam prescribed for inflammation and pain relief. - Tramadol prescribed for additional pain relief. - Advise patient to ice, elevate, and wrap the knee. - Recommend using a cane for ambulation to prevent falls. - Provide work note if needed, depending on her pain and functional status.      Relevant Medications   meloxicam (MOBIC) 15 MG tablet   Other Relevant Orders   Ambulatory referral to Orthopedic Surgery   Pre-diabetes    Ozempic Dosage Adjustment Patient missed two weeks of Ozempic and experienced side effects upon resuming the 2 mg dose. We will  plan to lower to the dose to restart the medication and avoid side effects.  Plan: - Restart Ozempic at a lower dose and titrate up as tolerated.      Other Visit Diagnoses     Type 2 diabetes mellitus with hyperglycemia, without long-term current use of insulin (HCC)       Relevant Medications   Semaglutide,0.25 or 0.5MG /DOS, (OZEMPIC, 0.25 OR 0.5 MG/DOSE,) 2 MG/3ML SOPN         Time: 29 minutes, >50% spent counseling, care coordination, chart review, and documentation.    Tollie Eth, DNP, AGNP-c   History,  Medications, Surgery, SDOH, and Family History reviewed and updated as appropriate.

## 2022-10-08 NOTE — Patient Instructions (Addendum)
Keep your leg propped up and wrapped as much as possible.   Be sure not to take the meloxicam with ibuprofen because these have some of the same medications in them.

## 2022-10-11 ENCOUNTER — Ambulatory Visit (INDEPENDENT_AMBULATORY_CARE_PROVIDER_SITE_OTHER): Payer: BC Managed Care – PPO | Admitting: Orthopaedic Surgery

## 2022-10-11 ENCOUNTER — Ambulatory Visit (INDEPENDENT_AMBULATORY_CARE_PROVIDER_SITE_OTHER): Payer: BC Managed Care – PPO

## 2022-10-11 DIAGNOSIS — M25561 Pain in right knee: Secondary | ICD-10-CM

## 2022-10-11 MED ORDER — TRIAMCINOLONE ACETONIDE 40 MG/ML IJ SUSP
80.0000 mg | INTRAMUSCULAR | Status: AC | PRN
  Administered 2022-10-11: 80 mg via INTRA_ARTICULAR

## 2022-10-11 MED ORDER — LIDOCAINE HCL 1 % IJ SOLN
4.0000 mL | INTRAMUSCULAR | Status: AC | PRN
  Administered 2022-10-11: 4 mL

## 2022-10-11 NOTE — Progress Notes (Signed)
Chief Complaint: Right knee pain     History of Present Illness:    Julie Bowers is a 52 y.o. female presents today with right knee pain for the last few months.  She states that she did get an injection in 2013 which helped dramatically.  She has done physical therapy in the past.  She did not have any specific injury.  She does state that the knee occasionally buckles.  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  She is walking and standing for a significant portion of the day.    Surgical History:   None  PMH/PSH/Family History/Social History/Meds/Allergies:    Past Medical History:  Diagnosis Date  . Anemia   . Hypertension    Past Surgical History:  Procedure Laterality Date  . FRACTURE SURGERY     Social History   Socioeconomic History  . Marital status: Divorced    Spouse name: Not on file  . Number of children: Not on file  . Years of education: Not on file  . Highest education level: Not on file  Occupational History  . Not on file  Tobacco Use  . Smoking status: Former    Types: Cigarettes    Quit date: 2015    Years since quitting: 9.5  . Smokeless tobacco: Never  . Tobacco comments:    1 pack per week   Vaping Use  . Vaping Use: Never used  Substance and Sexual Activity  . Alcohol use: Yes    Comment: occasional   . Drug use: No  . Sexual activity: Yes    Birth control/protection: None  Other Topics Concern  . Not on file  Social History Narrative  . Not on file   Social Determinants of Health   Financial Resource Strain: Not on file  Food Insecurity: Not on file  Transportation Needs: Not on file  Physical Activity: Not on file  Stress: Not on file  Social Connections: Not on file   Family History  Problem Relation Age of Onset  . Anemia Mother   . Stomach cancer Mother   . Heart disease Father   . Anemia Sister   . Stomach cancer Brother    No Known Allergies Current Outpatient  Medications  Medication Sig Dispense Refill  . amLODipine (NORVASC) 5 MG tablet Take 1 tablet (5 mg total) by mouth daily. (Patient not taking: Reported on 10/08/2022) 90 tablet 3  . chlorhexidine (PERIDEX) 0.12 % solution 15 mLs 2 (two) times daily. (Patient not taking: Reported on 10/08/2022)    . ibuprofen (ADVIL) 200 MG tablet Take 1,000 mg by mouth every 6 (six) hours as needed.    . Insulin Pen Needle (PEN NEEDLES) 32G X 4 MM MISC Use one new needle per week with injection of Ozempic (Patient not taking: Reported on 10/08/2022) 30 each 11  . lidocaine (LIDODERM) 5 % Place 1 patch onto the skin daily. Remove & Discard patch within 12 hours or as directed by MD (Patient not taking: Reported on 10/08/2022) 30 patch 0  . meloxicam (MOBIC) 15 MG tablet Take 1 tablet (15 mg total) by mouth daily. 30 tablet 2  . omeprazole (PRILOSEC) 20 MG capsule Take 1 capsule (20 mg total) by mouth daily. (Patient not taking: Reported on 10/08/2022) 30 capsule 0  .  Semaglutide,0.25 or 0.5MG /DOS, (OZEMPIC, 0.25 OR 0.5 MG/DOSE,) 2 MG/3ML SOPN Inject 0.25 mg as directed once a week for 28 days, THEN 0.5 mg once a week for 14 days. 3 mL 1  . traMADol (ULTRAM) 50 MG tablet Take 1 tablet (50 mg total) by mouth every 8 (eight) hours as needed for up to 5 days. 15 tablet 0   No current facility-administered medications for this visit.   No results found.  Review of Systems:   A ROS was performed including pertinent positives and negatives as documented in the HPI.  Physical Exam :   Constitutional: NAD and appears stated age Neurological: Alert and oriented Psych: Appropriate affect and cooperative There were no vitals taken for this visit.   Comprehensive Musculoskeletal Exam:    Tenderness palpation about the medial joint line as well as the patellofemoral joint.  Range of motion is from 0 to 130 degrees.  No swelling about the right knee, negative Lachman, negative posterior drawer, no varus or valgus  laxity  Imaging:   Xray (4 views right knee): Mild tricompartmental osteoarthritis   I personally reviewed and interpreted the radiographs.   Assessment:   52 y.o. female with very mild right knee osteoarthritis.  Given the fact that she does previously had extremely good relief from the injection I do believe that she would benefit from additional ultrasound-guided injection of the right knee.  I will plan to perform this today.  I will plan to see her back as needed.  Plan :    -Right knee ultrasound-guided injection performed with verbal consent obtained     Procedure Note  Patient: Julie Bowers             Date of Birth: 03/28/1971           MRN: [id number]             Visit Date: 10/11/2022  Procedures: Visit Diagnoses:  1. Right knee pain, unspecified chronicity     Large Joint Inj: R knee on 10/11/2022 10:50 AM Indications: pain Details: 22 G 1.5 in needle, ultrasound-guided anterior approach  Arthrogram: No  Medications: 4 mL lidocaine 1 %; 80 mg triamcinolone acetonide 40 MG/ML Outcome: tolerated well, no immediate complications Procedure, treatment alternatives, risks and benefits explained, specific risks discussed. Consent was given by the patient. Immediately prior to procedure a time out was called to verify the correct patient, procedure, equipment, support staff and site/side marked as required. Patient was prepped and draped in the usual sterile fashion.          I personally saw and evaluated the patient, and participated in the management and treatment plan.  Huel Cote, MD Attending Physician, Orthopedic Surgery  This document was dictated using Dragon voice recognition software. A reasonable attempt at proof reading has been made to minimize errors.

## 2022-11-16 NOTE — Assessment & Plan Note (Signed)
Patient presents with acute right knee pain and swelling in the setting of chronic arthritic changes. Medial tenderness and swelling is noted with no laxity assessed. Suspected meniscus tear and possible exacerbation of pre-existing arthritis and osteophyte formation. Plan: - Referral to orthopedics for further evaluation and management. - X-ray of the knee to assess for worsening of osteophyte formation or arthritis (optional, pending orthopedic consultation). - Meloxicam prescribed for inflammation and pain relief. - Tramadol prescribed for additional pain relief. - Advise patient to ice, elevate, and wrap the knee. - Recommend using a cane for ambulation to prevent falls. - Provide work note if needed, depending on her pain and functional status.

## 2022-11-16 NOTE — Assessment & Plan Note (Signed)
Ozempic Dosage Adjustment Patient missed two weeks of Ozempic and experienced side effects upon resuming the 2 mg dose. We will plan to lower to the dose to restart the medication and avoid side effects.  Plan: - Restart Ozempic at a lower dose and titrate up as tolerated.

## 2022-11-17 ENCOUNTER — Other Ambulatory Visit (HOSPITAL_BASED_OUTPATIENT_CLINIC_OR_DEPARTMENT_OTHER): Payer: Self-pay

## 2022-11-18 ENCOUNTER — Other Ambulatory Visit (HOSPITAL_BASED_OUTPATIENT_CLINIC_OR_DEPARTMENT_OTHER): Payer: Self-pay

## 2022-11-18 MED ORDER — FLUCONAZOLE 150 MG PO TABS
150.0000 mg | ORAL_TABLET | ORAL | 0 refills | Status: DC | PRN
  Filled 2022-11-18: qty 2, 6d supply, fill #0

## 2022-11-23 ENCOUNTER — Ambulatory Visit (INDEPENDENT_AMBULATORY_CARE_PROVIDER_SITE_OTHER): Payer: BC Managed Care – PPO | Admitting: Student

## 2022-11-23 DIAGNOSIS — M7051 Other bursitis of knee, right knee: Secondary | ICD-10-CM

## 2022-11-23 NOTE — Progress Notes (Signed)
Chief Complaint: Right knee pain     History of Present Illness:    Julie Bowers is a 52 y.o. female that presents today with right knee pain and swelling.  She did have a knee injection on 10/11/2022 which has helped significantly.  Patient states that her current pain started last week with no injury.  It is located on the front of the knee, below the knee joint.  She has been wearing a compression sleeve which does seem to help some.  Pain tends to be worse at night.  She does have to stand for long periods during the day for work.  Reports that she took some 800 mg ibuprofen last night which did help her pain levels.   Surgical History:   None  PMH/PSH/Family History/Social History/Meds/Allergies:    Past Medical History:  Diagnosis Date   Anemia    Hypertension    Past Surgical History:  Procedure Laterality Date   FRACTURE SURGERY     Social History   Socioeconomic History   Marital status: Divorced    Spouse name: Not on file   Number of children: Not on file   Years of education: Not on file   Highest education level: Not on file  Occupational History   Not on file  Tobacco Use   Smoking status: Former    Current packs/day: 0.00    Types: Cigarettes    Quit date: 2015    Years since quitting: 9.6   Smokeless tobacco: Never   Tobacco comments:    1 pack per week   Vaping Use   Vaping status: Never Used  Substance and Sexual Activity   Alcohol use: Yes    Comment: occasional    Drug use: No   Sexual activity: Yes    Birth control/protection: None  Other Topics Concern   Not on file  Social History Narrative   Not on file   Social Determinants of Health   Financial Resource Strain: Not on file  Food Insecurity: Not on file  Transportation Needs: Not on file  Physical Activity: Not on file  Stress: Not on file  Social Connections: Unknown (08/17/2021)   Received from [facility]   Social Network    Social  Network: Not on file   Family History  Problem Relation Age of Onset   Anemia Mother    Stomach cancer Mother    Heart disease Father    Anemia Sister    Stomach cancer Brother    No Known Allergies Current Outpatient Medications  Medication Sig Dispense Refill   amLODipine (NORVASC) 5 MG tablet Take 1 tablet (5 mg total) by mouth daily. (Patient not taking: Reported on 10/08/2022) 90 tablet 3   chlorhexidine (PERIDEX) 0.12 % solution 15 mLs 2 (two) times daily. (Patient not taking: Reported on 10/08/2022)     fluconazole (DIFLUCAN) 150 MG tablet Take 1 tablet (150 mg total) by mouth every 3 (three) days as needed. 2 tablet 0   ibuprofen (ADVIL) 200 MG tablet Take 1,000 mg by mouth every 6 (six) hours as needed.     Insulin Pen Needle (PEN NEEDLES) 32G X 4 MM MISC Use one new needle per week with injection of Ozempic (Patient not taking: Reported on 10/08/2022) 30 each 11   lidocaine (LIDODERM) 5 %  Place 1 patch onto the skin daily. Remove & Discard patch within 12 hours or as directed by MD (Patient not taking: Reported on 10/08/2022) 30 patch 0   meloxicam (MOBIC) 15 MG tablet Take 1 tablet (15 mg total) by mouth daily. 30 tablet 2   omeprazole (PRILOSEC) 20 MG capsule Take 1 capsule (20 mg total) by mouth daily. (Patient not taking: Reported on 10/08/2022) 30 capsule 0   Semaglutide,0.25 or 0.5MG /DOS, (OZEMPIC, 0.25 OR 0.5 MG/DOSE,) 2 MG/3ML SOPN Inject 0.25 mg as directed once a week for 28 days, THEN 0.5 mg once a week for 14 days. 3 mL 1   No current facility-administered medications for this visit.   No results found.  Review of Systems:   A ROS was performed including pertinent positives and negatives as documented in the HPI.  Physical Exam :   Constitutional: NAD and appears stated age Neurological: Alert and oriented Psych: Appropriate affect and cooperative There were no vitals taken for this visit.   Comprehensive Musculoskeletal Exam:    No significant medial or lateral  joint line tenderness.  There is some notable swelling on the medial aspect of the knee over the pes anserine bursa which is tender to palpation.  Active range of motion from 0 to 90 degrees limited by pain.  No instability or pain with varus or valgus stress.  Imaging:     Assessment:   52 y.o. female with medial right knee pain and swelling consistent with pes anserine bursitis.  Did discuss different treatment options for this, but that first-line treatment is typically conservative.  I recommend icing as well as continuing with ibuprofen for the next 1 to 2 weeks.  Discussed that should symptoms not improve over the next few weeks, we can see her back for further evaluation and possibly consider cortisone injection of the bursa at that time.  Otherwise we will plan to see her back as needed.  Plan :    -Return to clinic as needed    I personally saw and evaluated the patient, and participated in the management and treatment plan.   Hazle Nordmann, PA-C Orthopedics   This document was dictated using Conservation officer, historic buildings. A reasonable attempt at proof reading has been made to minimize errors.

## 2022-11-25 ENCOUNTER — Encounter: Payer: Self-pay | Admitting: Nurse Practitioner

## 2022-12-01 ENCOUNTER — Telehealth: Payer: Self-pay | Admitting: Nurse Practitioner

## 2022-12-01 ENCOUNTER — Other Ambulatory Visit: Payer: Self-pay

## 2022-12-01 ENCOUNTER — Other Ambulatory Visit (HOSPITAL_BASED_OUTPATIENT_CLINIC_OR_DEPARTMENT_OTHER): Payer: Self-pay

## 2022-12-01 MED ORDER — SEMAGLUTIDE (1 MG/DOSE) 4 MG/3ML ~~LOC~~ SOPN
1.0000 mg | PEN_INJECTOR | SUBCUTANEOUS | 1 refills | Status: DC
  Filled 2022-12-01 – 2023-02-02 (×2): qty 3, 28d supply, fill #0

## 2022-12-01 NOTE — Telephone Encounter (Signed)
Pt is requesting an increase in her ozempic. She says she has been at the same dosage for 2 weeks now. She uses [facility]

## 2022-12-03 ENCOUNTER — Other Ambulatory Visit (HOSPITAL_BASED_OUTPATIENT_CLINIC_OR_DEPARTMENT_OTHER): Payer: Self-pay

## 2022-12-13 ENCOUNTER — Other Ambulatory Visit (HOSPITAL_BASED_OUTPATIENT_CLINIC_OR_DEPARTMENT_OTHER): Payer: Self-pay

## 2022-12-13 ENCOUNTER — Telehealth: Payer: Self-pay | Admitting: Nurse Practitioner

## 2022-12-13 NOTE — Telephone Encounter (Signed)
P.A. OZEMPIC 

## 2022-12-26 ENCOUNTER — Other Ambulatory Visit (HOSPITAL_BASED_OUTPATIENT_CLINIC_OR_DEPARTMENT_OTHER): Payer: Self-pay

## 2022-12-28 ENCOUNTER — Telehealth: Payer: Self-pay

## 2022-12-28 NOTE — Telephone Encounter (Signed)
Key: [id number] Drug: Ozempic (1 MG/DOSE) 4MG Ronny Bacon pen-injectors Form: Ambulance person PA Form (2017 NCPDP) Determination: Wait for Determination

## 2022-12-28 NOTE — Telephone Encounter (Signed)
Julie Bowers resubmitting with chart notes

## 2022-12-29 ENCOUNTER — Other Ambulatory Visit (HOSPITAL_BASED_OUTPATIENT_CLINIC_OR_DEPARTMENT_OTHER): Payer: Self-pay

## 2022-12-29 NOTE — Telephone Encounter (Signed)
PA denied for Ozempic bc pt did not have any of the following lab results:  - A1C of 6.5 or greater - 2 hour plasma glucose of 200 mg/dL or greater during oral glucose tolerance test - random plasma glucose of 200 mg/dL or greater - fasting plasma glucose of 126 mg/dL or greater

## 2023-01-05 ENCOUNTER — Other Ambulatory Visit (HOSPITAL_BASED_OUTPATIENT_CLINIC_OR_DEPARTMENT_OTHER): Payer: Self-pay

## 2023-01-16 ENCOUNTER — Other Ambulatory Visit (HOSPITAL_BASED_OUTPATIENT_CLINIC_OR_DEPARTMENT_OTHER): Payer: Self-pay

## 2023-01-31 ENCOUNTER — Encounter: Payer: Self-pay | Admitting: Nurse Practitioner

## 2023-01-31 ENCOUNTER — Ambulatory Visit (INDEPENDENT_AMBULATORY_CARE_PROVIDER_SITE_OTHER): Payer: BC Managed Care – PPO | Admitting: Nurse Practitioner

## 2023-01-31 ENCOUNTER — Other Ambulatory Visit (HOSPITAL_BASED_OUTPATIENT_CLINIC_OR_DEPARTMENT_OTHER): Payer: Self-pay

## 2023-01-31 VITALS — BP 126/82 | HR 79 | Ht 69.0 in | Wt 267.4 lb

## 2023-01-31 DIAGNOSIS — Z1211 Encounter for screening for malignant neoplasm of colon: Secondary | ICD-10-CM | POA: Diagnosis not present

## 2023-01-31 DIAGNOSIS — I10 Essential (primary) hypertension: Secondary | ICD-10-CM | POA: Diagnosis not present

## 2023-01-31 DIAGNOSIS — Z0000 Encounter for general adult medical examination without abnormal findings: Secondary | ICD-10-CM

## 2023-01-31 DIAGNOSIS — Z23 Encounter for immunization: Secondary | ICD-10-CM

## 2023-01-31 DIAGNOSIS — Z6837 Body mass index (BMI) 37.0-37.9, adult: Secondary | ICD-10-CM

## 2023-01-31 DIAGNOSIS — R7303 Prediabetes: Secondary | ICD-10-CM

## 2023-01-31 DIAGNOSIS — R0602 Shortness of breath: Secondary | ICD-10-CM | POA: Insufficient documentation

## 2023-01-31 DIAGNOSIS — M25462 Effusion, left knee: Secondary | ICD-10-CM

## 2023-01-31 MED ORDER — IBUPROFEN 800 MG PO TABS
800.0000 mg | ORAL_TABLET | Freq: Three times a day (TID) | ORAL | 3 refills | Status: DC | PRN
  Filled 2023-01-31: qty 90, 30d supply, fill #0

## 2023-01-31 NOTE — Patient Instructions (Signed)
WEIGHT LOSS PLANNING Your progress today shows:     01/31/2023    3:30 PM 10/08/2022   11:05 AM 06/23/2022   10:14 AM  Vitals with BMI  Height 5\' 9"  5\' 9"    Weight 267 lbs 6 oz 250 lbs   BMI 39.47 36.9   Systolic 126 150 536  Diastolic 82 90 69  Pulse 79 80 72    For best management of weight, it is vital to balance intake versus output. This means the number of calories burned per day must be less than the calories you take in with food and drink.   I recommend trying to follow a diet with the following: Calories: 1200-1500 calories per day Carbohydrates: 150-180 grams of carbohydrates per day  Why: Gives your body enough "quick fuel" for cells to maintain normal function without sending them into starvation mode.  Protein: At least 90 grams of protein per day- 30 grams with each meal Why: Protein takes longer and uses more energy than carbohydrates to break down for fuel. The carbohydrates in your meals serves as quick energy sources and proteins help use some of that extra quick energy to break down to produce long term energy. This helps you not feel hungry as quickly and protein breakdown burns calories.  Water: Drink AT LEAST 64 ounces of water per day  Why: Water is essential to healthy metabolism. Water helps to fill the stomach and keep you fuller longer. Water is required for healthy digestion and filtering of waste in the body.  Fat: Limit fats in your diet- when choosing fats, choose foods with lower fats content such as lean meats (chicken, fish, [place]).  Why: Increased fat intake leads to storage "for later". Once you burn your carbohydrate energy, your body goes into fat and protein breakdown mode to help you loose weight.  Cholesterol: Fats and oils that are LIQUID at room temperature are best. Choose vegetable oils (olive oil, avocado oil, nuts). Avoid fats that are SOLID at room temperature (animal fats, processed meats). Healthy fats are often found in whole grains,  beans, nuts, seeds, and berries.  Why: Elevated cholesterol levels lead to build up of cholesterol on the inside of your blood vessels. This will eventually cause the blood vessels to become hard and can lead to high blood pressure and damage to your organs. When the blood flow is reduced, but the pressure is high from cholesterol buildup, parts of the cholesterol can break off and form clots that can go to the brain or heart leading to a stroke or heart attack.  Fiber: Increase amount of SOLUBLE the fiber in your diet. This helps to fill you up, lowers cholesterol, and helps with digestion. Some foods high in soluble fiber are oats, peas, beans, apples, carrots, barley, and citrus fruits.   Why: Fiber fills you up, helps remove excess cholesterol, and aids in healthy digestion which are all very important in weight management.   I recommend the following as a minimum activity routine: Purposeful walk or other physical activity at least 20 minutes every single day. This means purposefully taking a walk, jog, bike, swim, treadmill, elliptical, dance, etc.  This activity should be ABOVE your normal daily activities, such as walking at work. Goal exercise should be at least 150 minutes a week- work your way up to this.   Heart Rate: Your maximum exercise heart rate should be 220 - Your Age in Years. When exercising, get your heart rate up, but avoid going  over the maximum targeted heart rate.  60-70% of your maximum heart rate is where you tend to burn the most fat. To find this number:  220 - Age In Years= Max HR  Max HR x 0.6 (or 0.7) = Fat Burning HR The Fat Burning HR is your goal heart rate while working out to burn the most fat.  NEVER exercise to the point your feel lightheaded, weak, nauseated, dizzy. If you experience ANY of these symptoms- STOP exercise! Allow yourself to cool down and your heart rate to come down. Then restart slower next time.  If at ANY TIME you feel chest pain or chest  pressure during exercise, STOP IMMEDIATELY and seek medical attention.

## 2023-01-31 NOTE — Assessment & Plan Note (Addendum)
Reports discontinuation of Ozempic 1.5 months ago due to insurance issues. A1c status unknown. History of FBG 133 (care everywhere 06/21/18) and 160 (care everywhere 01/17/2022) are noted in the chart. -Attempt to restart Ozempic pending insurance approval. -Order A1c lab test and CMP for FBG reading.  -Continue with diet and exercise changes to help support improved weight. See AVS for recommendations

## 2023-01-31 NOTE — Assessment & Plan Note (Signed)
Reports improvement in knee pain with occasional use of ibuprofen. -Consider prescribing 800mg  ibuprofen if insurance covers it.

## 2023-01-31 NOTE — Assessment & Plan Note (Signed)

## 2023-01-31 NOTE — Assessment & Plan Note (Signed)
Reports intermittent episodes of difficulty breathing and swallowing with a sensation of inability to take a breath in or out for a brief 1-2 seconds. Based on descriptor, consider possibly related to diaphragm spasms. Episodes last a few seconds and occur 1-2 times per month. -Recommend monitoring for correlation with food, stress, etc -Consider diaphragm spasms or esophageal spasms triggering this and research next steps

## 2023-01-31 NOTE — Assessment & Plan Note (Signed)
Patient previously on amlodipine, but unable to tolerate. BP is within goal today with no concerning symptoms. She has been working on diet and exercise management and up until about a month ago utilized Ozempic for blood sugar and weight control. We will continue to monitor closely and ensure now that Ozempic is stopped BP remains less than 140/85

## 2023-01-31 NOTE — Assessment & Plan Note (Signed)
History of prediabetes with success taking semaglutide for management of blood sugars and weight. Chart review shows the patient has had 2 FBG >126 despite her A1c being in the prediabetic range. I strongly recommend diet and exercise management and strict glucose control to help manage blood sugars and weight to reduce risks.  -Repeat labs today -Attempt to restart Ozempic pending insurance approval

## 2023-01-31 NOTE — Progress Notes (Signed)
Shawna Clamp, DNP, AGNP-c [facility] [address] [place], [place] [address] Main Office [phone]  BP 126/82   Pulse 79   Ht 5\' 9"  (1.753 m)   Wt 267 lb 6.4 oz (121.3 kg)   BMI 39.49 kg/m    Subjective:    Patient ID: Julie Bowers, female    DOB: Sep 21, 1970, 52 y.o.   MRN: [id number]  HPI: Julie Bowers is a 52 y.o. female presenting on 01/31/2023 for comprehensive medical examination.   Month and 1/2 stopped ozempic.  Had to stop at the 1mg  dose  Spasms of the diaphragm- can't take a breath sometimes. Feels like she can't get air in or out.   Pertinent items are noted in HPI.  IMMUNIZATIONS:   Flu Vaccine: Flu vaccine declined, patient will complete later Prevnar 13: Prevnar 13 N/A for this patient Prevnar 20: Prevnar 20 N/A for this patient Pneumovax 23: Pneumovax 23 N/A for this patient Vac Shingrix: Shingrix due, given today Dose #1 HPV: N/A or Aged Out Tetanus: Tetanus completed in the last 10 years COVID: Declined today. Information on where to obtain the vaccine was provided.  RSV: No  HEALTH MAINTENANCE: Pap Smear HM Status: is up to date Mammogram HM Status: is up to date Colon Cancer Screening HM Status: was ordered today Bone Density HM Status: N/A STI Testing HM Status: N/A Lung CT HM Status: N/A  Concerns with vision, hearing, or dentition: No  Most Recent Depression Screen:     01/31/2023    3:28 PM 04/14/2021    2:53 PM 02/13/2021    2:48 PM  Depression screen PHQ 2/9  Decreased Interest 0 0 0  Down, Depressed, Hopeless 0 0 0  PHQ - 2 Score 0 0 0  Altered sleeping  0 2  Tired, decreased energy  0 1  Change in appetite  0 2  Feeling bad or failure about yourself   0 0  Trouble concentrating  0 0  Moving slowly or fidgety/restless  0 0  Suicidal thoughts  0 0  PHQ-9 Score  0 5  Difficult doing work/chores   Not difficult at all   Most Recent Anxiety Screen:     04/14/2021    2:54 PM 02/13/2021    2:48  PM  GAD 7 : Generalized Anxiety Score  Nervous, Anxious, on Edge 0 1  Control/stop worrying 0 1  Worry too much - different things 1 1  Trouble relaxing 0 0  Restless 0 0  Easily annoyed or irritable 1 0  Afraid - awful might happen 0 0  Total GAD 7 Score 2 3  Anxiety Difficulty  Not difficult at all   Most Recent Fall Screen:    01/31/2023    3:28 PM 04/14/2021    2:54 PM 02/13/2021    2:47 PM  Fall Risk   Falls in the past year? 0 0 0  Number falls in past yr: 0 0 0  Injury with Fall? 0 0 0  Risk for fall due to : No Fall Risks  No Fall Risks  Follow up Falls evaluation completed Falls evaluation completed Falls evaluation completed;Education provided    Past medical history, surgical history, medications, allergies, family history and social history reviewed with patient today and changes made to appropriate areas of the chart.  Past Medical History:  Past Medical History:  Diagnosis Date   Anemia    Anemia 03/03/2020   Encounter for medical examination to establish care 02/13/2021  Hypertension    Menorrhagia 09/28/2019   Right knee pain 11/23/2019   Acute on chronic.  Suspect meniscal injury with some recent exacerbation probably due to aging, weight, use.     Medications:  Current Outpatient Medications on File Prior to Visit  Medication Sig   Insulin Pen Needle (PEN NEEDLES) 32G X 4 MM MISC Use one new needle per week with injection of Ozempic   lidocaine (LIDODERM) 5 % Place 1 patch onto the skin daily. Remove & Discard patch within 12 hours or as directed by MD   meloxicam (MOBIC) 15 MG tablet Take 1 tablet (15 mg total) by mouth daily.   omeprazole (PRILOSEC) 20 MG capsule Take 1 capsule (20 mg total) by mouth daily.   Semaglutide, 1 MG/DOSE, 4 MG/3ML SOPN Inject 1 mg as directed once a week. (Patient not taking: Reported on 01/31/2023)   No current facility-administered medications on file prior to visit.   Surgical History:  Past Surgical History:   Procedure Laterality Date   FRACTURE SURGERY     Allergies:  No Known Allergies Family History:  Family History  Problem Relation Age of Onset   Anemia Mother    Stomach cancer Mother    Heart disease Father    Anemia Sister    Stomach cancer Brother        Objective:    BP 126/82   Pulse 79   Ht 5\' 9"  (1.753 m)   Wt 267 lb 6.4 oz (121.3 kg)   BMI 39.49 kg/m   Wt Readings from Last 3 Encounters:  01/31/23 267 lb 6.4 oz (121.3 kg)  10/08/22 250 lb (113.4 kg)  05/07/21 244 lb (110.7 kg)    Physical Exam Vitals and nursing note reviewed.  Constitutional:      General: She is not in acute distress.    Appearance: Normal appearance.  HENT:     Head: Normocephalic and atraumatic.     Right Ear: Hearing, tympanic membrane, ear canal and external ear normal.     Left Ear: Hearing, tympanic membrane, ear canal and external ear normal.     Nose: Nose normal.     Right Sinus: No maxillary sinus tenderness or frontal sinus tenderness.     Left Sinus: No maxillary sinus tenderness or frontal sinus tenderness.     Mouth/Throat:     Lips: Pink.     Mouth: Mucous membranes are moist.     Pharynx: Oropharynx is clear.  Eyes:     General: Lids are normal. Vision grossly intact.     Extraocular Movements: Extraocular movements intact.     Conjunctiva/sclera: Conjunctivae normal.     Pupils: Pupils are equal, round, and reactive to light.     Funduscopic exam:    Right eye: Red reflex present.        Left eye: Red reflex present.    Visual Fields: Right eye visual fields normal and left eye visual fields normal.  Neck:     Thyroid: No thyromegaly.     Vascular: No carotid bruit.  Cardiovascular:     Rate and Rhythm: Normal rate and regular rhythm.     Chest Wall: PMI is not displaced.     Pulses: Normal pulses.          Dorsalis pedis pulses are 2+ on the right side and 2+ on the left side.       Posterior tibial pulses are 2+ on the right side and 2+ on the left side.  Heart sounds: Normal heart sounds. No murmur heard. Pulmonary:     Effort: Pulmonary effort is normal. No respiratory distress.     Breath sounds: Normal breath sounds.  Abdominal:     General: Abdomen is flat. Bowel sounds are normal. There is no distension.     Palpations: Abdomen is soft. There is no hepatomegaly, splenomegaly or mass.     Tenderness: There is no abdominal tenderness. There is no right CVA tenderness, left CVA tenderness, guarding or rebound.  Musculoskeletal:        General: Normal range of motion.     Cervical back: Full passive range of motion without pain, normal range of motion and neck supple. No tenderness.     Right lower leg: No edema.     Left lower leg: No edema.  Feet:     Left foot:     Toenail Condition: Left toenails are normal.  Lymphadenopathy:     Cervical: No cervical adenopathy.     Upper Body:     Right upper body: No supraclavicular adenopathy.     Left upper body: No supraclavicular adenopathy.  Skin:    General: Skin is warm and dry.     Capillary Refill: Capillary refill takes less than 2 seconds.     Nails: There is no clubbing.  Neurological:     General: No focal deficit present.     Mental Status: She is alert and oriented to person, place, and time.     GCS: GCS eye subscore is 4. GCS verbal subscore is 5. GCS motor subscore is 6.     Sensory: Sensation is intact.     Motor: Motor function is intact.     Coordination: Coordination is intact.     Gait: Gait is intact.     Deep Tendon Reflexes: Reflexes are normal and symmetric.  Psychiatric:        Attention and Perception: Attention normal.        Mood and Affect: Mood normal.        Speech: Speech normal.        Behavior: Behavior normal. Behavior is cooperative.        Thought Content: Thought content normal.        Cognition and Memory: Cognition and memory normal.        Judgment: Judgment normal.      Results for orders placed or performed during the hospital  encounter of 06/23/22  Basic metabolic panel  Result Value Ref Range   Sodium 138 135 - 145 mmol/L   Potassium 4.0 3.5 - 5.1 mmol/L   Chloride 103 98 - 111 mmol/L   CO2 27 22 - 32 mmol/L   Glucose, Bld 94 70 - 99 mg/dL   BUN 16 6 - 20 mg/dL   Creatinine, Ser 6.30 0.44 - 1.00 mg/dL   Calcium 9.6 8.9 - 16.0 mg/dL   GFR, Estimated >10 >93 mL/min   Anion gap 8 5 - 15  CBC  Result Value Ref Range   WBC 6.7 4.0 - 10.5 K/uL   RBC 4.53 3.87 - 5.11 MIL/uL   Hemoglobin 13.9 12.0 - 15.0 g/dL   HCT 23.5 57.3 - 22.0 %   MCV 96.2 80.0 - 100.0 fL   MCH 30.7 26.0 - 34.0 pg   MCHC 31.9 30.0 - 36.0 g/dL   RDW 25.4 27.0 - 62.3 %   Platelets 386 150 - 400 K/uL   nRBC 0.0 0.0 - 0.2 %  Pregnancy,  urine  Result Value Ref Range   Preg Test, Ur NEGATIVE NEGATIVE  Troponin I (High Sensitivity)  Result Value Ref Range   Troponin I (High Sensitivity) 4 <18 ng/L       Assessment & Plan:   Problem List Items Addressed This Visit     Primary hypertension    Patient previously on amlodipine, but unable to tolerate. BP is within goal today with no concerning symptoms. She has been working on diet and exercise management and up until about a month ago utilized Ozempic for blood sugar and weight control. We will continue to monitor closely and ensure now that Ozempic is stopped BP remains less than 140/85      Relevant Orders   CBC with Differential/Platelet   CMP14+EGFR   Hemoglobin A1c   Lipid panel   Microalbumin / creatinine urine ratio   Cologuard   Encounter for annual physical exam - Primary    CPE completed today. Review of HM activities and recommendations discussed and provided on AVS. Anticipatory guidance, diet, and exercise recommendations provided. Medications, allergies, and hx reviewed and updated as necessary. Orders placed as listed below.  Plan: - Labs ordered. Will make changes as necessary based on results.  - I will review these results and send recommendations via MyChart or  a telephone call.  - F/U with CPE in 1 year or sooner for acute/chronic health needs as directed.        Relevant Orders   CBC with Differential/Platelet   CMP14+EGFR   Hemoglobin A1c   Lipid panel   Microalbumin / creatinine urine ratio   Cologuard   BMI 37.0-37.9, adult    Reports discontinuation of Ozempic 1.5 months ago due to insurance issues. A1c status unknown. History of FBG 133 (care everywhere 06/21/18) and 160 (care everywhere 01/17/2022) are noted in the chart. -Attempt to restart Ozempic pending insurance approval. -Order A1c lab test and CMP for FBG reading.  -Continue with diet and exercise changes to help support improved weight. See AVS for recommendations      Relevant Orders   CBC with Differential/Platelet   CMP14+EGFR   Hemoglobin A1c   Lipid panel   Microalbumin / creatinine urine ratio   Cologuard   Pre-diabetes    History of prediabetes with success taking semaglutide for management of blood sugars and weight. Chart review shows the patient has had 2 FBG >126 despite her A1c being in the prediabetic range. I strongly recommend diet and exercise management and strict glucose control to help manage blood sugars and weight to reduce risks.  -Repeat labs today -Attempt to restart Ozempic pending insurance approval      Swelling of joint of left knee    Reports improvement in knee pain with occasional use of ibuprofen. -Consider prescribing 800mg  ibuprofen if insurance covers it.      Relevant Medications   ibuprofen (ADVIL) 800 MG tablet   Breath shortness    Reports intermittent episodes of difficulty breathing and swallowing with a sensation of inability to take a breath in or out for a brief 1-2 seconds. Based on descriptor, consider possibly related to diaphragm spasms. Episodes last a few seconds and occur 1-2 times per month. -Recommend monitoring for correlation with food, stress, etc -Consider diaphragm spasms or esophageal spasms triggering this  and research next steps      Other Visit Diagnoses     Need for shingles vaccine       Relevant Orders   Zoster Recombinant (Shingrix ) (  Completed)   Screening for colon cancer       Relevant Orders   Cologuard         Follow up plan: Return in about 1 year (around 01/31/2024) for CPE.  NEXT PREVENTATIVE PHYSICAL DUE IN 1 YEAR.  PATIENT COUNSELING PROVIDED FOR ALL ADULT PATIENTS: A well balanced diet low in saturated fats, cholesterol, and moderation in carbohydrates.  This can be as simple as monitoring portion sizes and cutting back on sugary beverages such as soda and juice to start with.    Daily water consumption of at least 64 ounces.  Physical activity at least 180 minutes per week.  If just starting out, start 10 minutes a day and work your way up.   This can be as simple as taking the stairs instead of the elevator and walking 2-3 laps around the office  purposefully every day.   STD protection, partner selection, and regular testing if high risk.  Limited consumption of alcoholic beverages if alcohol is consumed. For men, I recommend no more than 14 alcoholic beverages per week, spread out throughout the week (max 2 per day). Avoid "binge" drinking or consuming large quantities of alcohol in one setting.  Please let me know if you feel you may need help with reduction or quitting alcohol consumption.   Avoidance of nicotine, if used. Please let me know if you feel you may need help with reduction or quitting nicotine use.   Daily mental health attention. This can be in the form of 5 minute daily meditation, prayer, journaling, yoga, reflection, etc.  Purposeful attention to your emotions and mental state can significantly improve your overall wellbeing  and  Health.  Please know that I am here to help you with all of your health care goals and am happy to work with you to find a solution that works best for you.  The greatest advice I have received with any  changes in life are to take it one step at a time, that even means if all you can focus on is the next 60 seconds, then do that and celebrate your victories.  With any changes in life, you will have set backs, and that is OK. The important thing to remember is, if you have a set back, it is not a failure, it is an opportunity to try again! Screening Testing Mammogram Every 1 -2 years based on history and risk factors Starting at age 62 Pap Smear Ages 21-39 every 3 years Ages 1-65 every 5 years with HPV testing More frequent testing may be required based on results and history Colon Cancer Screening Every 1-10 years based on test performed, risk factors, and history Starting at age 56 Bone Density Screening Every 2-10 years based on history Starting at age 15 for women Recommendations for men differ based on medication usage, history, and risk factors AAA Screening One time ultrasound Men 60-87 years old who have every smoked Lung Cancer Screening Low Dose Lung CT every 12 months Age 9-80 years with a 30 pack-year smoking history who still smoke or who have quit within the last 15 years   Screening Labs Routine  Labs: Complete Blood Count (CBC), Complete Metabolic Panel (CMP), Cholesterol (Lipid Panel) Every 6-12 months based on history and medications May be recommended more frequently based on current conditions or previous results Hemoglobin A1c Lab Every 3-12 months based on history and previous results Starting at age 66 or earlier with diagnosis of diabetes, high cholesterol, BMI >  26, and/or risk factors Frequent monitoring for patients with diabetes to ensure blood sugar control Thyroid Panel (TSH) Every 6 months based on history, symptoms, and risk factors May be repeated more often if on medication HIV One time testing for all patients 18 and older May be repeated more frequently for patients with increased risk factors or exposure Hepatitis C One time testing for  all patients 30 and older May be repeated more frequently for patients with increased risk factors or exposure Gonorrhea, Chlamydia Every 12 months for all sexually active persons 13-24 years Additional monitoring may be recommended for those who are considered high risk or who have symptoms Every 12 months for any woman on birth control, regardless of sexual activity PSA Men 49-70 years old with risk factors Additional screening may be recommended from age 67-69 based on risk factors, symptoms, and history  Vaccine Recommendations Tetanus Booster All adults every 10 years Flu Vaccine All patients 6 months and older every year COVID Vaccine All patients 12 years and older Initial dosing with booster May recommend additional booster based on age and health history HPV Vaccine 2 doses all patients age 43-26 Dosing may be considered for patients over 26 Shingles Vaccine (Shingrix) 2 doses all adults 55 years and older Pneumonia (Pneumovax 23) All adults 65 years and older May recommend earlier dosing based on health history One year apart from Prevnar 24 Pneumonia (Prevnar 34) All adults 65 years and older Dosed 1 year after Pneumovax 23 Pneumonia (Prevnar 20) One time alternative to the two dosing of 13 and 23 For all adults with initial dose of 23, 20 is recommended 1 year later For all adults with initial dose of 13, 23 is still recommended as second option 1 year later

## 2023-02-01 ENCOUNTER — Telehealth: Payer: Self-pay | Admitting: Nurse Practitioner

## 2023-02-01 LAB — CMP14+EGFR
ALT: 18 [IU]/L (ref 0–32)
AST: 20 [IU]/L (ref 0–40)
Albumin: 4.5 g/dL (ref 3.8–4.9)
Alkaline Phosphatase: 103 [IU]/L (ref 44–121)
BUN/Creatinine Ratio: 15 (ref 9–23)
BUN: 16 mg/dL (ref 6–24)
Bilirubin Total: 0.3 mg/dL (ref 0.0–1.2)
CO2: 26 mmol/L (ref 20–29)
Calcium: 9.9 mg/dL (ref 8.7–10.2)
Chloride: 102 mmol/L (ref 96–106)
Creatinine, Ser: 1.04 mg/dL — ABNORMAL HIGH (ref 0.57–1.00)
Globulin, Total: 2.7 g/dL (ref 1.5–4.5)
Glucose: 90 mg/dL (ref 70–99)
Potassium: 4.6 mmol/L (ref 3.5–5.2)
Sodium: 141 mmol/L (ref 134–144)
Total Protein: 7.2 g/dL (ref 6.0–8.5)
eGFR: 64 mL/min/{1.73_m2} (ref >59)

## 2023-02-01 LAB — LIPID PANEL
Chol/HDL Ratio: 4.4 ratio (ref 0.0–4.4)
Cholesterol, Total: 271 mg/dL — ABNORMAL HIGH (ref 100–199)
HDL: 62 mg/dL (ref >39)
LDL Chol Calc (NIH): 190 mg/dL — ABNORMAL HIGH (ref 0–99)
Triglycerides: 109 mg/dL (ref 0–149)
VLDL Cholesterol Cal: 19 mg/dL (ref 5–40)

## 2023-02-01 LAB — CBC WITH DIFFERENTIAL/PLATELET
Basophils Absolute: 0.1 10*3/uL (ref 0.0–0.2)
Basos: 1 %
EOS (ABSOLUTE): 0.2 10*3/uL (ref 0.0–0.4)
Eos: 2 %
Hematocrit: 42.9 % (ref 34.0–46.6)
Hemoglobin: 13.8 g/dL (ref 11.1–15.9)
Immature Grans (Abs): 0.1 10*3/uL (ref 0.0–0.1)
Immature Granulocytes: 1 %
Lymphocytes Absolute: 2.4 10*3/uL (ref 0.7–3.1)
Lymphs: 27 %
MCH: 31.4 pg (ref 26.6–33.0)
MCHC: 32.2 g/dL (ref 31.5–35.7)
MCV: 98 fL — ABNORMAL HIGH (ref 79–97)
Monocytes Absolute: 0.9 10*3/uL (ref 0.1–0.9)
Monocytes: 10 %
Neutrophils Absolute: 5.3 10*3/uL (ref 1.4–7.0)
Neutrophils: 59 %
Platelets: 377 10*3/uL (ref 150–450)
RBC: 4.39 x10E6/uL (ref 3.77–5.28)
RDW: 12.5 % (ref 11.7–15.4)
WBC: 8.9 10*3/uL (ref 3.4–10.8)

## 2023-02-01 LAB — HEMOGLOBIN A1C
Est. average glucose Bld gHb Est-mCnc: 120 mg/dL
Hgb A1c MFr Bld: 5.8 % — ABNORMAL HIGH (ref 4.8–5.6)

## 2023-02-01 NOTE — Telephone Encounter (Signed)
Pt called back about update on her ozempic?

## 2023-02-02 ENCOUNTER — Other Ambulatory Visit (HOSPITAL_BASED_OUTPATIENT_CLINIC_OR_DEPARTMENT_OTHER): Payer: Self-pay

## 2023-02-04 ENCOUNTER — Other Ambulatory Visit (HOSPITAL_BASED_OUTPATIENT_CLINIC_OR_DEPARTMENT_OTHER): Payer: Self-pay

## 2023-02-08 ENCOUNTER — Other Ambulatory Visit (HOSPITAL_BASED_OUTPATIENT_CLINIC_OR_DEPARTMENT_OTHER): Payer: Self-pay

## 2023-02-10 ENCOUNTER — Other Ambulatory Visit (HOSPITAL_BASED_OUTPATIENT_CLINIC_OR_DEPARTMENT_OTHER): Payer: Self-pay

## 2023-02-15 ENCOUNTER — Other Ambulatory Visit (HOSPITAL_BASED_OUTPATIENT_CLINIC_OR_DEPARTMENT_OTHER): Payer: Self-pay

## 2023-02-26 ENCOUNTER — Other Ambulatory Visit (HOSPITAL_BASED_OUTPATIENT_CLINIC_OR_DEPARTMENT_OTHER): Payer: Self-pay

## 2023-03-10 ENCOUNTER — Other Ambulatory Visit: Payer: BC Managed Care – PPO

## 2023-03-11 ENCOUNTER — Other Ambulatory Visit (HOSPITAL_BASED_OUTPATIENT_CLINIC_OR_DEPARTMENT_OTHER): Payer: Self-pay

## 2023-03-13 LAB — COLOGUARD: COLOGUARD: NEGATIVE

## 2023-03-24 ENCOUNTER — Other Ambulatory Visit (HOSPITAL_BASED_OUTPATIENT_CLINIC_OR_DEPARTMENT_OTHER): Payer: Self-pay

## 2023-04-07 ENCOUNTER — Other Ambulatory Visit: Payer: BC Managed Care – PPO

## 2023-08-02 ENCOUNTER — Other Ambulatory Visit: Payer: Self-pay

## 2023-10-26 ENCOUNTER — Ambulatory Visit: Payer: Self-pay

## 2023-10-26 ENCOUNTER — Other Ambulatory Visit (HOSPITAL_BASED_OUTPATIENT_CLINIC_OR_DEPARTMENT_OTHER): Payer: Self-pay

## 2023-10-26 ENCOUNTER — Ambulatory Visit: Admitting: Family Medicine

## 2023-10-26 ENCOUNTER — Encounter: Payer: Self-pay | Admitting: Family Medicine

## 2023-10-26 VITALS — BP 150/90 | HR 84 | Temp 99.3°F | Ht 69.0 in | Wt 273.6 lb

## 2023-10-26 DIAGNOSIS — R202 Paresthesia of skin: Secondary | ICD-10-CM | POA: Diagnosis not present

## 2023-10-26 DIAGNOSIS — E559 Vitamin D deficiency, unspecified: Secondary | ICD-10-CM

## 2023-10-26 DIAGNOSIS — R7303 Prediabetes: Secondary | ICD-10-CM

## 2023-10-26 DIAGNOSIS — I10 Essential (primary) hypertension: Secondary | ICD-10-CM | POA: Diagnosis not present

## 2023-10-26 DIAGNOSIS — R350 Frequency of micturition: Secondary | ICD-10-CM

## 2023-10-26 LAB — POCT GLYCOSYLATED HEMOGLOBIN (HGB A1C): Hemoglobin A1C: 5.8 % — AB (ref 4.0–5.6)

## 2023-10-26 LAB — POCT URINALYSIS DIP (PROADVANTAGE DEVICE)
Bilirubin, UA: NEGATIVE
Glucose, UA: NEGATIVE mg/dL
Ketones, POC UA: NEGATIVE mg/dL
Nitrite, UA: NEGATIVE
Protein Ur, POC: NEGATIVE mg/dL
Specific Gravity, Urine: 1.01
Urobilinogen, Ur: 0.2
pH, UA: 6 (ref 5.0–8.0)

## 2023-10-26 MED ORDER — VALSARTAN-HYDROCHLOROTHIAZIDE 80-12.5 MG PO TABS
1.0000 | ORAL_TABLET | Freq: Every day | ORAL | 0 refills | Status: DC
  Filled 2023-10-26: qty 30, 30d supply, fill #0

## 2023-10-26 NOTE — Telephone Encounter (Signed)
   FYI Only or Action Required?: FYI only for provider.  Patient was last seen in primary care on 01/31/2023 by Early, Camie BRAVO, NP.  Called Nurse Triage reporting Tingling.  Symptoms began about a month ago.  Interventions attempted: Nothing.  Symptoms are: unchanged.  Triage Disposition: See PCP When Office is Open (Within 3 Days)  Patient/caregiver understands and will follow disposition?: Yes Copied from CRM #[id number]. Topic: Clinical - Red Word Triage >> Oct 26, 2023  9:43 AM Charlet HERO wrote: Red Word that prompted transfer to Nurse Triage: Patient is calling about tingling in feet and finger tips she was trying to make appt and dt denied and said sent to nt. Reason for Disposition  [1] Numbness or tingling in one or both hands AND [2] is a chronic symptom (recurrent or ongoing AND present > 4 weeks)  Answer Assessment - Initial Assessment Questions 1. SYMPTOM: What is the main symptom you are concerned about? (e.g., weakness, numbness)     Tingling in fingertips and toes 2. ONSET: When did this start? (e.g., minutes, hours, days; while sleeping)     About six weeks ago  4. PATTERN Does this come and go, or has it been constant since it started?  Is it present now?     Comes and goes 5. CARDIAC SYMPTOMS: Have you had any of the following symptoms: chest pain, difficulty breathing, palpitations?     denies 6. NEUROLOGIC SYMPTOMS: Have you had any of the following symptoms: headache, dizziness, vision loss, double vision, changes in speech, unsteady on your feet?     Headaches when tingling to fingers and toes 7. OTHER SYMPTOMS: Do you have any other symptoms?     Reports left eye twitching  Protocols used: Neurologic Deficit-A-AH

## 2023-10-26 NOTE — Progress Notes (Signed)
 Chief Complaint  Patient presents with   Tingling    Tingling in her fingertips and toes off and on over the last 6 weeks. Has been up 3-4 times each night to use the bathroom, just frequency-no other urinary symptom.    She presents with complaints of tingling in fingertips and toes intermittently over the last 6 weeks.  It only involves the tips of the fingers and toes, involving all digits, bilaterally. She notes some difficulty with her memory for the last couple of months, more forgetful (could only remember 1 of 10 items she bought at the grocery store when her boyfriend asked her).  She also reports urinary frequency, up 3-4 times/night to void.  This has been going on for longer than 6 weeks.  She doesn't have any urinary frequency during the day (only 2-3 times). No dysuria or hematuria. Has 1 cup of coffee daily, 1 tea daily, no other caffeine.  She has been having headaches almost daily, usually at night (not all day long).  She goes to bed, and it often goes away.  Doesn't take any medication for the HA.  She takes ibuprofen  every morning for her knee pain.   Pre-diabetes--previously took ozempic , stopped due to insurance coverage. She has regained weight since stopping it. Denies polydipsia, polyuria, just nocturia. Lab Results  Component Value Date   HGBA1C 5.8 (H) 01/31/2023    HTN--previously didn't tolerate amlodipine  (caused headaches).  At last visit with PCP in 01/2023 BP was doing okay off meds.She has gained weight since then, and admits to seasoning her food with a lot of salt. She has lunch meats sometimes, bacon. Not much chips/pretzels, pickles/olives/soy sauce or canned foods. She does not monitor BP elsewhere.  BP Readings from Last 3 Encounters:  10/26/23 (!) 180/90  01/31/23 126/82  10/08/22 (!) 150/90    Vitamin D  deficiency--level was 16.7 in 02/2021. She was treated with Rx vitamin D  x 12 weeks.  She hasn't been taking any vitamin D   supplements.  PMH, PSH, SH reviewed  Outpatient Encounter Medications as of 10/26/2023  Medication Sig Note   ibuprofen  (ADVIL ) 200 MG tablet Take 1,000 mg by mouth every 6 (six) hours as needed. 10/26/2023: Last dose 9am yesterday 1000mg    [DISCONTINUED] ibuprofen  (ADVIL ) 800 MG tablet Take 1 tablet (800 mg total) by mouth every 8 (eight) hours as needed.    [DISCONTINUED] Insulin  Pen Needle (PEN NEEDLES) 32G X 4 MM MISC Use one new needle per week with injection of Ozempic     [DISCONTINUED] lidocaine  (LIDODERM ) 5 % Place 1 patch onto the skin daily. Remove & Discard patch within 12 hours or as directed by MD 01/31/2023: prn   [DISCONTINUED] meloxicam  (MOBIC ) 15 MG tablet Take 1 tablet (15 mg total) by mouth daily. 01/31/2023: prn   [DISCONTINUED] omeprazole  (PRILOSEC) 20 MG capsule Take 1 capsule (20 mg total) by mouth daily. 01/31/2023: prn   [DISCONTINUED] Semaglutide , 1 MG/DOSE, 4 MG/3ML SOPN Inject 1 mg as directed once a week. (Patient not taking: Reported on 01/31/2023) 01/31/2023: Not approved by ins.    No facility-administered encounter medications on file as of 10/26/2023.   No Known Allergies  ROS: no f/c, URI symptoms, CP, SOB.  Some edema at the end of the day (stands all day) Headaches, memory issues, nocturia and tingling per HPI. Frequent heartburn (especially after greasy foods, tomato sauces).  Denies dysphagia Denies mouth swelling, tongue pain, or other oral changes. +weight gain (23# since stopping ozempic  in August, 6# since  last visit in October). Whole body is sore since learning to swim on vacation to [facility].  PHYSICAL EXAM:  BP (!) 150/90 (BP Location: Left Arm, Cuff Size: Large)   Pulse 84   Temp 99.3 F (37.4 C) (Tympanic)   Ht 5' 9 (1.753 m)   Wt 273 lb 9.6 oz (124.1 kg)   BMI 40.40 kg/m  Initial BP 180/90, lower on recheck Wt Readings from Last 3 Encounters:  10/26/23 273 lb 9.6 oz (124.1 kg)  01/31/23 267 lb 6.4 oz (121.3 kg)  10/08/22 250 lb (113.4  kg)   Pleasant, well-appearing female in no distress HEENT: conjunctiva and sclera are clear, EOMI, fundi benign. OP clear Mole mid upper R eyelid Neck: no lymphadenopathy, thyromegaly or carotid bruit Heart: regular rate and rhythm Lungs: clear bilaterally Abdomen: soft, nontender, no mass Extremities: no edema Neuro: alert and oriented, cranial nerves 2-12 intact. Normal strength, sensation. DTRs 1+ and symmetric   Lab Results  Component Value Date   HGBA1C 5.8 (A) 10/26/2023    Urine: trace blood, trace leuks, negative nitrite, ketones, glucose. SG 1.010   ASSESSMENT/PLAN:  Paresthesias - unclear etiology, check labs.  Normal neuro exam - Plan: CBC with Differential/Platelet, Comprehensive metabolic panel with GFR, TSH, Vitamin B12  Pre-diabetes - A1c stable (rechecked due to paresthesias and urinary frequency).  Reviewed proper diet, daily exercise, encouraged wt loss - Plan: HgB A1c, Comprehensive metabolic panel with GFR  Primary hypertension - start valsartan  HCT. Risks/SE reviewed.  Low Na diet. Monitor BP.  f/u 2-3 weeks with PCP - Plan: Comprehensive metabolic panel with GFR, valsartan -hydrochlorothiazide  (DIOVAN -HCT) 80-12.5 MG tablet  Urinary frequency - no infection.  Encouraged hydration during day, limiting fluids in the evening. - Plan: POCT Urinalysis DIP (Proadvantage Device)  Vitamin D  deficiency - not taking supplements, suspect it will be low again. Discussed need for longterm OTC supplementation - Plan: VITAMIN D  25 Hydroxy (Vit-D Deficiency, Fractures)  Urine didn't show any glucose and A1c pre-diabetic. Need to look for other causes of her symptoms, check labs (not from high sugars).   F/u 2-3 weeks with PCP on her blood pressure  I spent 47 minutes dedicated to the care of this patient, including pre-visit review of records, face to face time, post-visit ordering of testing and documentation.  Try and limit the sodium in your diet--see  handout. Consider wearing compression socks when working (prolonged standing) to help prevent the swelling.  Please try and monitor your blood pressure regularly. We are going to start a new blood pressure medication that contains a small dose of diuretic (this helps with swelling, but also can cause you to urinate more frequently). Take it in the morning.  Stay well hydrated, but drink your fluids early in the day to prevent frequent bathroom trips at night.  Try and get some exercise every day--ideally 30 minutes daily, but can be broken up into 10-15 minute periods (walking, dancing). This helps keep sugars down, as well as blood pressure, and is great for preventing cardiovascular disease.  Return to the office in 2 weeks.  Please bring your list of blood pressures that were checked elsewhere. If you got or borrowed a BP monitor, bring it with you to the visit.  Review of your chart showed that you have very low vitamin D  levels in 2022. Since you haven't been taking any vitamins, I suspect it will be low again (which can affect overall how you feel).  If it is very low, you may need  another prescription to get the levels up faster. It is important that once you finish any prescription (if prescribed) that you start taking an over-the-counter vitamin D3 supplement, and continue that long-term (usually 1000-2000 IU daily)--I will let you know my recommendations based on the lab results.

## 2023-10-26 NOTE — Patient Instructions (Addendum)
 Try and limit the sodium in your diet--see handout. Consider wearing compression socks when working (prolonged standing) to help prevent the swelling.  Please try and monitor your blood pressure regularly. We are going to start a new blood pressure medication that contains a small dose of diuretic (this helps with swelling, but also can cause you to urinate more frequently). Take it in the morning.  Stay well hydrated, but drink your fluids early in the day to prevent frequent bathroom trips at night.  Try and get some exercise every day--ideally 30 minutes daily, but can be broken up into 10-15 minute periods (walking, dancing). This helps keep sugars down, as well as blood pressure, and is great for preventing cardiovascular disease.  Return to the office in 2-3 weeks.  Please bring your list of blood pressures that were checked elsewhere. If you got or borrowed a BP monitor, bring that with you to the visit.  Review of your chart showed that you have very low vitamin D  levels in 2022. Since you haven't been taking any vitamins, I suspect it will be low again (which can affect overall how you feel).  If it is very low, you may need another prescription to get the levels up faster. It is important that once you finish any prescription (if prescribed) that you start taking an over-the-counter vitamin D3 supplement, and continue that long-term (usually 1000-2000 IU daily)--I will let you know my recommendations based on the lab results.

## 2023-10-27 ENCOUNTER — Ambulatory Visit: Payer: Self-pay | Admitting: Family Medicine

## 2023-10-27 LAB — CBC WITH DIFFERENTIAL/PLATELET
Basophils Absolute: 0.1 x10E3/uL (ref 0.0–0.2)
Basos: 1 %
EOS (ABSOLUTE): 0.3 x10E3/uL (ref 0.0–0.4)
Eos: 5 %
Hematocrit: 40.8 % (ref 34.0–46.6)
Hemoglobin: 13 g/dL (ref 11.1–15.9)
Immature Grans (Abs): 0.1 x10E3/uL (ref 0.0–0.1)
Immature Granulocytes: 1 %
Lymphocytes Absolute: 2 x10E3/uL (ref 0.7–3.1)
Lymphs: 28 %
MCH: 30.7 pg (ref 26.6–33.0)
MCHC: 31.9 g/dL (ref 31.5–35.7)
MCV: 97 fL (ref 79–97)
Monocytes Absolute: 0.8 x10E3/uL (ref 0.1–0.9)
Monocytes: 11 %
Neutrophils Absolute: 3.9 x10E3/uL (ref 1.4–7.0)
Neutrophils: 54 %
Platelets: 355 x10E3/uL (ref 150–450)
RBC: 4.23 x10E6/uL (ref 3.77–5.28)
RDW: 12.5 % (ref 11.7–15.4)
WBC: 7.1 x10E3/uL (ref 3.4–10.8)

## 2023-10-27 LAB — COMPREHENSIVE METABOLIC PANEL WITH GFR
ALT: 20 IU/L (ref 0–32)
AST: 19 IU/L (ref 0–40)
Albumin: 4.3 g/dL (ref 3.8–4.9)
Alkaline Phosphatase: 92 IU/L (ref 44–121)
BUN/Creatinine Ratio: 10 (ref 9–23)
BUN: 9 mg/dL (ref 6–24)
Bilirubin Total: 0.4 mg/dL (ref 0.0–1.2)
CO2: 23 mmol/L (ref 20–29)
Calcium: 9.7 mg/dL (ref 8.7–10.2)
Chloride: 100 mmol/L (ref 96–106)
Creatinine, Ser: 0.87 mg/dL (ref 0.57–1.00)
Globulin, Total: 2.5 g/dL (ref 1.5–4.5)
Glucose: 79 mg/dL (ref 70–99)
Potassium: 4.1 mmol/L (ref 3.5–5.2)
Sodium: 138 mmol/L (ref 134–144)
Total Protein: 6.8 g/dL (ref 6.0–8.5)
eGFR: 79 mL/min/1.73 (ref >59)

## 2023-10-27 LAB — VITAMIN D 25 HYDROXY (VIT D DEFICIENCY, FRACTURES): Vit D, 25-Hydroxy: 13.7 ng/mL — AB (ref 30.0–100.0)

## 2023-10-27 LAB — VITAMIN B12: Vitamin B-12: 423 pg/mL (ref 232–1245)

## 2023-10-27 LAB — TSH: TSH: 1.42 u[IU]/mL (ref 0.450–4.500)

## 2023-11-17 ENCOUNTER — Other Ambulatory Visit (HOSPITAL_BASED_OUTPATIENT_CLINIC_OR_DEPARTMENT_OTHER): Payer: Self-pay

## 2023-11-17 ENCOUNTER — Encounter: Payer: Self-pay | Admitting: Nurse Practitioner

## 2023-11-17 ENCOUNTER — Ambulatory Visit: Admitting: Nurse Practitioner

## 2023-11-17 VITALS — BP 132/82 | HR 72 | Wt 270.2 lb

## 2023-11-17 DIAGNOSIS — Z6837 Body mass index (BMI) 37.0-37.9, adult: Secondary | ICD-10-CM | POA: Diagnosis not present

## 2023-11-17 DIAGNOSIS — R600 Localized edema: Secondary | ICD-10-CM

## 2023-11-17 DIAGNOSIS — M25512 Pain in left shoulder: Secondary | ICD-10-CM | POA: Diagnosis not present

## 2023-11-17 DIAGNOSIS — I10 Essential (primary) hypertension: Secondary | ICD-10-CM | POA: Diagnosis not present

## 2023-11-17 DIAGNOSIS — R7303 Prediabetes: Secondary | ICD-10-CM | POA: Diagnosis not present

## 2023-11-17 MED ORDER — ZEPBOUND 2.5 MG/0.5ML ~~LOC~~ SOAJ
2.5000 mg | SUBCUTANEOUS | 0 refills | Status: DC
  Filled 2023-11-17: qty 2, 28d supply, fill #0

## 2023-11-17 MED ORDER — MELOXICAM 15 MG PO TABS
15.0000 mg | ORAL_TABLET | Freq: Every day | ORAL | 0 refills | Status: DC
  Filled 2023-11-17: qty 30, 30d supply, fill #0

## 2023-11-17 NOTE — Assessment & Plan Note (Signed)
  1. Pre-diabetes  2. Primary hypertension

## 2023-11-17 NOTE — Assessment & Plan Note (Signed)
  1. Pre-diabetes  2. Primary hypertension  3. Acute pain of left shoulder

## 2023-11-17 NOTE — Assessment & Plan Note (Signed)
  1. Pre-diabetes  2. Primary hypertension - Comprehensive metabolic panel with GFR  3. Acute pain of left shoulder - meloxicam  (MOBIC ) 15 MG tablet; Take 1 tablet (15 mg total) by mouth daily.  4. BMI 37.0-37.9, adult - tirzepatide  (ZEPBOUND ) 2.5 MG/0.5ML Pen; Inject 2.5 mg into the skin once a week.

## 2023-11-17 NOTE — Patient Instructions (Addendum)
 Try completely resting the shoulder for the next week.  Use ice 20 minutes at a time and heat 20 minutes at a time, whichever feels better daily (as many times as you would like).  Keep using the avocado alcohol if this helps.  Practice the stretches daily. If it is not better in a week, then we will have you see Dr. Vita to discuss possible injection.   OR- since you have been dealing with this since July we can get you in sooner  Continue with the new blood pressure medicine. Your blood pressure is looking so much better!    WEIGHT LOSS PLANNING  We will try to get medication covered, but in the meantime, I want you to make sure you are following the recommendations below.    Your progress today shows:     11/17/2023    1:30 PM 10/26/2023    2:23 PM 10/26/2023    1:25 PM  Vitals with BMI  Height   5' 9  Weight 270 lbs 3 oz  273 lbs 10 oz  BMI 39.88  40.39  Systolic 132 150 819  Diastolic 82 90 90  Pulse 72  84    For best management of weight, it is vital to balance intake versus output. This means the number of calories burned per day must be less than the calories you take in with food and drink.   I recommend trying to follow a diet with the following: Calories: 1200-1500 calories per day Carbohydrates: 150-180 grams of carbohydrates per day  Why: Gives your body enough quick fuel for cells to maintain normal function without sending them into starvation mode.  Protein: At least 90 grams of protein per day- 30 grams with each meal Why: Protein takes longer and uses more energy than carbohydrates to break down for fuel. The carbohydrates in your meals serves as quick energy sources and proteins help use some of that extra quick energy to break down to produce long term energy. This helps you not feel hungry as quickly and protein breakdown burns calories.  Water: Drink AT LEAST 64 ounces of water per day  Why: Water is essential to healthy metabolism. Water helps to fill  the stomach and keep you fuller longer. Water is required for healthy digestion and filtering of waste in the body.  Fat: Limit fats in your diet- when choosing fats, choose foods with lower fats content such as lean meats (chicken, fish, [place]).  Why: Increased fat intake leads to storage for later. Once you burn your carbohydrate energy, your body goes into fat and protein breakdown mode to help you loose weight.  Cholesterol: Fats and oils that are LIQUID at room temperature are best. Choose vegetable oils (olive oil, avocado oil, nuts). Avoid fats that are SOLID at room temperature (animal fats, processed meats). Healthy fats are often found in whole grains, beans, nuts, seeds, and berries.  Why: Elevated cholesterol levels lead to build up of cholesterol on the inside of your blood vessels. This will eventually cause the blood vessels to become hard and can lead to high blood pressure and damage to your organs. When the blood flow is reduced, but the pressure is high from cholesterol buildup, parts of the cholesterol can break off and form clots that can go to the brain or heart leading to a stroke or heart attack.  Fiber: Increase amount of SOLUBLE the fiber in your diet. This helps to fill you up, lowers cholesterol, and helps with  digestion. Some foods high in soluble fiber are oats, peas, beans, apples, carrots, barley, and citrus fruits.   Why: Fiber fills you up, helps remove excess cholesterol, and aids in healthy digestion which are all very important in weight management.   I recommend the following as a minimum activity routine: Purposeful walk or other physical activity at least 20 minutes every single day.  This means purposefully taking a walk, jog, bike, swim, treadmill, elliptical, dance, etc.  This activity should be ABOVE your normal daily activities, such as walking at work. Goal exercise should be at least 150 minutes a week- work your way up to this.   Heart Rate: Your  maximum exercise heart rate should be 220 - Your Age in Years. When exercising, get your heart rate up, but avoid going over the maximum targeted heart rate.  60-70% of your maximum heart rate is where you tend to burn the most fat. To find this number:  220 - Age In Years= Max HR  Max HR x 0.6 (or 0.7) = Fat Burning HR  (For you this is 100 beats a minute) The Fat Burning HR is your goal heart rate while working out to burn the most fat.  NEVER exercise to the point your feel lightheaded, weak, nauseated, dizzy. If you experience ANY of these symptoms- STOP exercise! Allow yourself to cool down and your heart rate to come down. Then restart slower next time.  If at ANY TIME you feel chest pain or chest pressure during exercise, STOP IMMEDIATELY and seek medical attention.

## 2023-11-17 NOTE — Assessment & Plan Note (Signed)
 1. Prediabetes

## 2023-11-17 NOTE — Progress Notes (Unsigned)
 Julie Doing, DNP, AGNP-c [facility]  [address] [place], [place] [address] [phone]  ESTABLISHED PATIENT- Chronic Health and/or Follow-Up Visit on vaginal discharge  Blood pressure 132/82, pulse 72, weight 270 lb 3.2 oz (122.6 kg).   HPI: History of Present Illness Julie Bowers is a 53 year old female with hypertension who presents with shoulder pain.  She has been experiencing significant shoulder pain since around the Fourth of July, which began after learning to swim during a vacation. The patient reports shoulder pain that is severe enough to limit arm movement. She reports that the pain has been worsening over time and that she has tried resting and changing sleeping positions without significant relief. She has been using a homemade remedy of avocado seed and alcohol for temporary relief and takes ibuprofen  for pain management.  She has a history of hypertension and is currently taking valsartan  and hydrochlorothiazide . Her blood pressure has improved, with current readings around 132/82 mmHg, down from previous highs of 180/90 mmHg. She experiences minimal side effects, with only occasional mild headaches. She also reports some swelling in her feet, which she attributes to walking 7,000 to 8,000 steps daily.  She recently went on a vacation to [place] , where she learned to swim and spent time with her grandchildren. She mentions a weight gain issue, attributing it to menopause and expressing difficulty in managing her weight despite attempts at intermittent fasting. No dizziness, vision changes, or significant headaches, but she reports occasional mild headaches and some swelling in her feet.  All ROS negative with exception of what is listed above.    PHYSICAL EXAM Physical Exam MSK: ROM normal. No visible deformities or swelling. Tenderness in the left shoulder at the deltoid insertion point. SKIN: Warm, dry, intact. No rashes or concerning lesions  present. NEURO: Alert and oriented x 4. No weakness or sensory deficits. Coordination and gait intact. PSYCH: Mood and affect normal. Cooperative and pleasant. CV: Heart rate and rhythm normal. S1 and S2 auscultated with no extrasystoles. No murmur or rubs present. Radial and pedal pulses intact and regular. No carotid bruit. No LE edema. Capillary refill 2 to 3 seconds. PULM: Respirations even and unlabored. Lungs clear bilaterally in all fields. No wheezing.  PLAN Left shoulder pain due to suspected deltoid tendinopathy and possible rotator cuff or bursitis injury Chronic left shoulder pain exacerbated by activity, with tenderness at the deltoid insertion point. Pain persistent since before July 4th, with no specific injury recalled. Differential includes deltoid tendinopathy, rotator cuff injury, and bursitis. Overuse suspected as a contributing factor. - Prescribe meloxicam  once daily for inflammation. - Advise rest and minimize use of the left shoulder. - Provide stretches to be done once daily. - Apply heat or ice for 20 minutes as needed for pain relief. - Continue using avocado seed and alcohol application if beneficial. - Refer to Dr. Vita for potential ultrasound-guided steroid injection if no improvement in one week.  Essential hypertension Hypertension managed with valsartan  and hydrochlorothiazide . Blood pressure improved, with current readings better than previous elevated levels. Occasional mild headaches noted. No vision changes or significant dizziness reported. - Continue current antihypertensive regimen with valsartan  and hydrochlorothiazide . - Monitor kidney function due to diuretic use.  Obesity Difficulty with weight management, possibly exacerbated by menopause. Previous attempts at intermittent fasting unsuccessful. Interest in exploring weight loss medications, with prior use of Ozempic  noted. - Investigate insurance coverage for weight loss medications, including  ZipBound. - Provide dietary guidance with emphasis on protein and  water intake. - Encourage physical activity that elevates heart rate to fat-burning zone.  Lower extremity edema Mild lower extremity edema likely related to prolonged standing and walking. Reports walking 7,000-8,000 steps daily. - Encourage continued physical activity.  Return for shoulder pain-Dr Vita.  SaraBeth Abaigeal Moomaw, DNP, AGNP-c Time: 33 minutes, >50% spent counseling, care coordination, chart review, and documentation.

## 2023-11-18 ENCOUNTER — Other Ambulatory Visit: Payer: Self-pay | Admitting: Nurse Practitioner

## 2023-11-18 ENCOUNTER — Other Ambulatory Visit (HOSPITAL_COMMUNITY): Payer: Self-pay

## 2023-11-18 ENCOUNTER — Ambulatory Visit: Admitting: Family Medicine

## 2023-11-18 ENCOUNTER — Telehealth: Payer: Self-pay

## 2023-11-18 ENCOUNTER — Encounter: Payer: Self-pay | Admitting: Family Medicine

## 2023-11-18 VITALS — BP 128/72 | HR 68 | Ht 69.0 in | Wt 269.6 lb

## 2023-11-18 DIAGNOSIS — S46002A Unspecified injury of muscle(s) and tendon(s) of the rotator cuff of left shoulder, initial encounter: Secondary | ICD-10-CM

## 2023-11-18 LAB — COMPREHENSIVE METABOLIC PANEL WITH GFR
ALT: 17 IU/L (ref 0–32)
AST: 22 IU/L (ref 0–40)
Albumin: 4.3 g/dL (ref 3.8–4.9)
Alkaline Phosphatase: 91 IU/L (ref 44–121)
BUN/Creatinine Ratio: 12 (ref 9–23)
BUN: 12 mg/dL (ref 6–24)
Bilirubin Total: 0.4 mg/dL (ref 0.0–1.2)
CO2: 22 mmol/L (ref 20–29)
Calcium: 9.7 mg/dL (ref 8.7–10.2)
Chloride: 99 mmol/L (ref 96–106)
Creatinine, Ser: 0.99 mg/dL (ref 0.57–1.00)
Globulin, Total: 2.8 g/dL (ref 1.5–4.5)
Glucose: 105 mg/dL — ABNORMAL HIGH (ref 70–99)
Potassium: 4.1 mmol/L (ref 3.5–5.2)
Sodium: 137 mmol/L (ref 134–144)
Total Protein: 7.1 g/dL (ref 6.0–8.5)
eGFR: 68 mL/min/1.73 (ref >59)

## 2023-11-18 NOTE — Progress Notes (Signed)
   Name: Julie Bowers   Date of Visit: 11/18/23   Date of last visit with me: Visit date not found   CHIEF COMPLAINT:  Chief Complaint  Patient presents with   other    Left shoulder pain-started 2 months ago, got worse after vacation, can lift up yo midways but can't use arm.       HPI:  Discussed the use of AI scribe software for clinical note transcription with the patient, who gave verbal consent to proceed.  History of Present Illness   Julie Bowers is a 53 year old female with a history of rotator cuff injury who presents with shoulder pain.  She experiences significant shoulder pain, particularly when lifting her arm, which is exacerbated at night. The pain is primarily located in the shoulder with some radiation to the back and intensifies with pressure on certain areas of the shoulder.  She has not yet started taking meloxicam , which was prescribed for pain relief and inflammation, as she has not picked it up from the pharmacy.  She may have aggravated her shoulder while swimming, although the pain was present prior to this activity. No previous bicep injuries are reported.  Certain movements, such as sliding her hand behind her back or across her chest, increase the pain. She also reports difficulty holding her arm up for extended periods due to pain.  There is a history of a previous rotator cuff injury.         OBJECTIVE:       01/31/2023    3:28 PM  Depression screen PHQ 2/9  Decreased Interest 0  Down, Depressed, Hopeless 0  PHQ - 2 Score 0     BP Readings from Last 3 Encounters:  11/18/23 128/72  11/17/23 132/82  10/26/23 (!) 150/90    BP 128/72   Pulse 68   Ht 5' 9 (1.753 m)   Wt 269 lb 9.6 oz (122.3 kg)   SpO2 98%   BMI 39.81 kg/m    Physical Exam   MUSCULOSKELETAL: Inspection reveals no gross abnormality of the left shoulder.  There is tenderness to palpation over the biceps tendon and the humeral head.  Range of motion is limited  with abduction as well as external rotation.  Internal rotation is appropriate.  Significant weakness noted with abduction against resistance as well as external rotation against resistance.     Physical Exam  ASSESSMENT/PLAN:   Assessment & Plan    Assessment and Plan    Left rotator cuff tear with subacromial-subdeltoid bursitis Acute flare-up with significant pain on movement. Ultrasound shows supraspinatus and possible infraspinatus tendon tear, fluid under scapula, and large bursa. Suspected tendinitis. Risk of frozen shoulder if not mobilized. - Prescribed meloxicam  daily for two weeks. - Advised to avoid strenuous activities, maintain gentle range of motion. - Instructed to ice shoulder 20 minutes, three times daily. - Reassess in 4-5 weeks to evaluate improvement, consider MRI. - Delay strengthening exercises for two weeks, focus on range of motion. - Schedule follow-up in 4-6 weeks for further management.  -Patient's ultrasound findings do show concern for supraspinatus tendon tear at the mid body portion.  Notable fluid in the biceps groove as well.        [name] A. Vita MD [facility]

## 2023-11-18 NOTE — Telephone Encounter (Signed)
 Pharmacy Patient Advocate Encounter  Insurance verification completed.   The patient is insured through [employer] test claim for ZEPBOUND  2.5MG /0.5ML. Currently Zepbound  is not on [employer] formulary and is Excluded.          This test claim was processed through [facility]- copay amounts may vary at other pharmacies due to pharmacy/plan contracts, or as the patient moves through the different stages of their insurance plan.

## 2023-11-20 ENCOUNTER — Other Ambulatory Visit: Payer: Self-pay | Admitting: Medical Genetics

## 2023-11-21 ENCOUNTER — Ambulatory Visit: Payer: Self-pay | Admitting: Nurse Practitioner

## 2023-11-21 ENCOUNTER — Encounter: Payer: Self-pay | Admitting: Nurse Practitioner

## 2023-11-21 ENCOUNTER — Other Ambulatory Visit (HOSPITAL_BASED_OUTPATIENT_CLINIC_OR_DEPARTMENT_OTHER): Payer: Self-pay

## 2023-11-21 ENCOUNTER — Ambulatory Visit: Admitting: Nurse Practitioner

## 2023-11-21 VITALS — BP 132/88 | HR 54 | Wt 268.2 lb

## 2023-11-21 DIAGNOSIS — T783XXA Angioneurotic edema, initial encounter: Secondary | ICD-10-CM

## 2023-11-21 DIAGNOSIS — I10 Essential (primary) hypertension: Secondary | ICD-10-CM

## 2023-11-21 LAB — POCT RAPID STREP A (OFFICE): Rapid Strep A Screen: NEGATIVE

## 2023-11-21 MED ORDER — FAMOTIDINE 40 MG/5ML PO SUSR
20.0000 mg | Freq: Two times a day (BID) | ORAL | 0 refills | Status: DC
  Filled 2023-11-21: qty 50, 10d supply, fill #0

## 2023-11-21 MED ORDER — METHYLPREDNISOLONE SODIUM SUCC 125 MG IJ SOLR
125.0000 mg | Freq: Once | INTRAMUSCULAR | Status: AC
  Administered 2023-11-21: 125 mg via INTRAMUSCULAR

## 2023-11-21 MED ORDER — EPINEPHRINE 0.3 MG/0.3ML IJ SOAJ
0.3000 mg | INTRAMUSCULAR | 2 refills | Status: AC | PRN
  Filled 2023-11-21: qty 2, 30d supply, fill #0

## 2023-11-21 MED ORDER — CETIRIZINE HCL 5 MG/5ML PO SOLN
10.0000 mg | Freq: Two times a day (BID) | ORAL | 0 refills | Status: DC
  Filled 2023-11-21: qty 118, 6d supply, fill #0

## 2023-11-21 MED ORDER — PREDNISONE 20 MG PO TABS
ORAL_TABLET | ORAL | 0 refills | Status: AC
  Filled 2023-11-21: qty 13, 7d supply, fill #0

## 2023-11-21 NOTE — Addendum Note (Signed)
 Addended byBETHA BUCKS, Atharva Mirsky D on: 11/21/2023 10:02 AM   Modules accepted: Orders

## 2023-11-21 NOTE — Progress Notes (Addendum)
 Camie FORBES Doing, DNP, AGNP-c [facility] [address] [place], [place] [address] [phone]   ACUTE VISIT on 11/21/2023  Blood pressure 132/88, pulse (!) 54, weight 268 lb 3.2 oz (121.7 kg).  Subjective:  HPI History of Present Illness Julie Bowers is a 53 year old female who presents with a possible allergic reaction to medications.  She has experienced tongue swelling and difficulty swallowing since Saturday, following the initiation of meloxicam  on Friday, August 15th. Her tongue feels 'cut up' and painful, with a sensation akin to having 'swallowed gas'. No breathing difficulties, but she cannot swallow due to the pain.   She stopped taking meloxicam  on Sunday, August 17th, but symptoms have not improved. No itching, rash, difficulty breathing, wheezing, dizziness, or fever, but she has a slight headache and pain when attempting to cough. Her tongue feels thicker than normal.  Her medication history includes recent use of valsartan  and hydrochlorothiazide , started on July 23rd. She did not take valsartan  on the morning of the visit. She has previously taken amlodipine , which caused headaches.   No runny nose, body aches, or chills. Swallowing is painful, and her throat hurts when she tilts her head back.  ROS negative except for what is listed in HPI. History, Medications, Surgery, SDOH, and Family History reviewed and updated as appropriate.  Objective:  Physical Exam Vitals and nursing note reviewed.  Constitutional:      General: She is not in acute distress.    Appearance: She is obese. She is not ill-appearing, toxic-appearing or diaphoretic.  HENT:     Head: Normocephalic and atraumatic.     Nose: Nose normal. No congestion or rhinorrhea.     Mouth/Throat:     Lips: Pink.     Mouth: Mucous membranes are moist. Angioedema present. No oral lesions.     Tongue: No lesions. Tongue does not deviate from midline.     Palate: No mass and lesions.      Pharynx: Oropharynx is clear. Uvula midline. No pharyngeal swelling, oropharyngeal exudate, posterior oropharyngeal erythema or uvula swelling.     Tonsils: No tonsillar exudate. 2+ on the right. 2+ on the left.     Comments: Tongue appears slightly enlarged with visible papillae noted. Posterior oropharynx visualized without signs of edema.  Eyes:     Extraocular Movements: Extraocular movements intact.     Pupils: Pupils are equal, round, and reactive to light.  Neck:     Vascular: No carotid bruit.  Cardiovascular:     Rate and Rhythm: Normal rate and regular rhythm.     Pulses: Normal pulses.     Heart sounds: Normal heart sounds.  Pulmonary:     Effort: Pulmonary effort is normal. No respiratory distress.     Breath sounds: Normal breath sounds. No stridor. No wheezing, rhonchi or rales.  Chest:     Chest wall: No tenderness.  Musculoskeletal:     Cervical back: Normal range of motion and neck supple. No rigidity or tenderness.     Right lower leg: No edema.     Left lower leg: No edema.  Lymphadenopathy:     Cervical: No cervical adenopathy.  Skin:    General: Skin is warm and dry.     Capillary Refill: Capillary refill takes less than 2 seconds.  Neurological:     General: No focal deficit present.     Mental Status: She is alert and oriented to person, place, and time.  Psychiatric:  Mood and Affect: Mood normal.         Assessment & Plan:   Problem List Items Addressed This Visit     Primary hypertension   Recent medication change from amlodipine  to valsartan -hydrochlorothiazide  with sudden onset of throat and tongue pain and tongue swelling over the weekend. Meloxicam  was started on Friday, therefore, this is the likely culprit, but at this time unable to rule out valsartan  as a cause. Blood pressures are slightly elevated today, she has not taken the medication. I recommend continuing to hold the blood pressure medication and meloxicam  while we treat for the  angioedema. I would like her to keep an eye on her blood pressure at home. We will plan to restart medication once the symptoms have resolved. We can consider restart of valsartan -hydrochlorothiazide  and very close monitoring with 1/2 tab dosing to ensure that this is not the cause. Will hold off on adding to allergy list for now until it is clear.  - Monitor BP at home and let me know what it is looking like - We will consider restart of BP medications once the symptoms have all resolved. When restarting, will plan for 1/2 dose and monitor closely for any symptoms.       Relevant Medications   EPINEPHrine  (EPIPEN  2-PAK) 0.3 mg/0.3 mL IJ SOAJ injection   Allergic angioedema - Primary   Acute angioedema of the tongue and oropharynx, likely drug-induced, with meloxicam  and valsartan  as potential culprits. Suspect meloxicam  more likely given timing of symptom onset and initial dosing. Symptoms include mild tongue swelling, difficulty swallowing, and oropharyngeal pain. Papillae are present on the tongue, which is reassuring. No respiratory distress or wheezing. Lungs are clear. Posterior oropharynx is open with no evidence of edema at this time. Differential includes meloxicam  and valsartan , with a stronger suspicion towards meloxicam . No itching or fever. Strep throat considered but less likely and testing negative. . - Discontinue meloxicam  and valsartan  immediately. - Administer Solu-Medrol  injection for rapid symptom relief. - Prescribe cetirizine  (Zyrtec ) liquid 10mg  BID for antihistamine properties.  - Prescribe famotidine  (Pepcid ) 20 mg  BID for antihistamine properties.  - Prescribe prednisone  to start the following day, with instructions to take in the morning. - Strict precautions discussed and provided on AVS for review in the event symptoms worsen, change, or become concerning.  - Provide an EpiPen  for emergency use if breathing difficulties or increased swelling occur. - Instruct to call  911 if EpiPen  is used or if symptoms worsen significantly. - Consider lidocaine  or Chloraseptic spray for throat pain relief. - Avoid work today; reassess ability to return tomorrow based on symptom improvement - MyChart message for the future sent to check on patient tomorrow.       Relevant Medications   predniSONE  (DELTASONE ) 20 MG tablet   cetirizine  HCl (ZYRTEC ) 5 MG/5ML SOLN   famotidine  (PEPCID ) 40 MG/5ML suspension   EPINEPHrine  (EPIPEN  2-PAK) 0.3 mg/0.3 mL IJ SOAJ injection      Camie FORBES Doing, DNP, AGNP-c Time: 35 minutes, >50% spent counseling, care coordination, chart review, and documentation.

## 2023-11-21 NOTE — Assessment & Plan Note (Addendum)
 Acute angioedema of the tongue and oropharynx, likely drug-induced, with meloxicam  and valsartan  as potential culprits. Suspect meloxicam  more likely given timing of symptom onset and initial dosing. Symptoms include mild tongue swelling, difficulty swallowing, and oropharyngeal pain. Papillae are present on the tongue, which is reassuring. No respiratory distress or wheezing. Lungs are clear. Posterior oropharynx is open with no evidence of edema at this time. Differential includes meloxicam  and valsartan , with a stronger suspicion towards meloxicam . No itching or fever. Strep throat considered but less likely and testing negative. . - Discontinue meloxicam  and valsartan  immediately. - Administer Solu-Medrol  injection for rapid symptom relief. - Prescribe cetirizine  (Zyrtec ) liquid 10mg  BID for antihistamine properties.  - Prescribe famotidine  (Pepcid ) 20 mg  BID for antihistamine properties.  - Prescribe prednisone  to start the following day, with instructions to take in the morning. - Strict precautions discussed and provided on AVS for review in the event symptoms worsen, change, or become concerning.  - Provide an EpiPen  for emergency use if breathing difficulties or increased swelling occur. - Instruct to call 911 if EpiPen  is used or if symptoms worsen significantly. - Consider lidocaine  or Chloraseptic spray for throat pain relief. - Avoid work today; reassess ability to return tomorrow based on symptom improvement - MyChart message for the future sent to check on patient tomorrow.

## 2023-11-21 NOTE — Assessment & Plan Note (Signed)
 Recent medication change from amlodipine  to valsartan -hydrochlorothiazide  with sudden onset of throat and tongue pain and tongue swelling over the weekend. Meloxicam  was started on Friday, therefore, this is the likely culprit, but at this time unable to rule out valsartan  as a cause. Blood pressures are slightly elevated today, she has not taken the medication. I recommend continuing to hold the blood pressure medication and meloxicam  while we treat for the angioedema. I would like her to keep an eye on her blood pressure at home. We will plan to restart medication once the symptoms have resolved. We can consider restart of valsartan -hydrochlorothiazide  and very close monitoring with 1/2 tab dosing to ensure that this is not the cause. Will hold off on adding to allergy list for now until it is clear.  - Monitor BP at home and let me know what it is looking like - We will consider restart of BP medications once the symptoms have all resolved. When restarting, will plan for 1/2 dose and monitor closely for any symptoms.

## 2023-11-21 NOTE — Patient Instructions (Addendum)
 STOP: Valsartan -hydrochlorothiazide  and Meloxicam .  Do not take these again unless you have been told it is ok to do so.   START: TODAY: Famotidine  (Pepcid ) 20mg  in the morning and 20mg  at bedtime every day for 7 days        Cetirizine  (Zyrtec ) 10mg  in the morning and 10mg  at bedtime every day for  7 days TOMORROW: Prednisone  taper pack for a total of 7 days.   I have sent in a prescription for an EpiPen - Use this immediately if you have trouble breathing or feel like your mouth/throat is swelling worse. Call 911  If your swelling is getting worse, go to the emergency room.   If the sore throat is not better by this afternoon, let me know by sending a MyChart message.   Get chloraseptic or similar to spray into the throat to help with sore throat while the medication is working.     Angioedema Angioedema is the sudden swelling of tissue in the body. Angioedema can affect any part of the body, including the legs, hands, genitals, face, mouth, lips, and internal organs, like your intestines. Depending on the cause, angioedema may happen just once. However, some people may have repeated bouts of angioedema during their lives. Symptoms may be mild and may occur along with other allergic symptoms such as itchy, red, swollen areas of skin (hives). Severe angioedema can be life-threatening if it affects the air passages and blocks breathing. What are the causes? This condition may be caused by: Foods, such as milk, eggs, shellfish, wheat, or nuts. Certain medicines, such as ACE inhibitors, birth control pills, dyes used in X-rays, or NSAIDs, such as ibuprofen . Hereditary angioedema (HAE) is genetic. Episodes can be triggered by: Illness, infection, or emotional or physical stress. Changes in hormone levels. Exercise. Minor surgical or dental procedures. In some cases, the cause of this condition is not known. What increases the risk? You are more likely to develop HAE if you have family  members with this condition.  What are the signs or symptoms?  Symptoms of this condition depend on where the swelling happens.  Symptoms of this condition include: Swollen skin. Hives. Pain, pressure, or tenderness in the affected area. Swollen eyelids, face, lips, or tongue. Trouble drinking, swallowing, or closing the mouth completely. Hoarseness or sore throat. Wheezing or trouble breathing. If your internal organs are affected, symptoms may also include: Nausea. Pain in the abdomen. Vomiting or diarrhea. Trouble swallowing. Trouble passing urine. How is this diagnosed? This condition may be diagnosed based on: An exam of the affected area. Your medical history. Whether anyone in your family has had this condition before. A review of any medicines you have been taking. Tests, including: Allergy skin tests to see if the condition was caused by an allergic reaction. Blood tests to see if the condition was caused by certain inherited or genetic diseases. How is this treated? Treatment for this condition depends on the cause and severity of your symptoms. It may involve any of the following: Avoiding triggers, if they are known. Triggers may include foods or environmental allergens. Stopping medicines permanently if they cause the condition. These include ACE inhibitors. Taking medicines to treat symptoms or prevent future episodes. These may include: Antihistamines. Epinephrine  injections. Steroids. Blood products to treat specific types of non-allergic angioedema. Breathing tubes or ventilators in severe cases in which breathing is affected. Severe cases of angioedema are treated at the hospital. Mild to moderate angioedema usually gets better in 24-48 hours. Follow these  instructions at home:  Take over-the-counter and prescription medicines only as told by your health care provider. If you were given medicines for emergency allergy treatment, always carry them with you.  This includes epinephrine  injector kits. Wear a medical bracelet as told by your health care provider. If something triggers your condition, avoid the trigger. Triggers can be foods, environmental allergens, stress, or exercise. Avoid all medicines that caused your angioedema. This is for your entire life. If your condition is inherited and you are thinking about having children, talk to your health care provider. It is important to discuss the risks of passing on the condition to your children. Where to find more information American Academy of Allergy Asthma & Immunology: www.aaaai.org Contact a health care provider if: You continue to have repeated episodes of angioedema. Episodes of angioedema start to happen more often than they used to, even after you take steps to prevent them. You have episodes of angioedema that are more severe than they have been before, even after you take steps to prevent them. You are thinking about having children. Get help right away if: You have severe swelling of your mouth, tongue, or lips. Your swelling gets worse. You have trouble breathing, swallowing, or talking. You have chest pain, dizziness or light-headedness, or you pass out. These symptoms may represent a serious problem that is an emergency. Do not wait to see if the symptoms will go away. Get medical help right away. Call your local emergency services (911 in the [place] Do not drive yourself to the hospital. Summary Angioedema is the sudden swelling of tissues. It is important to be aware of all triggers or causes for your angioedema and to avoid them. Treatment for this condition depends on the cause and severity of your symptoms. Severe angioedema can be life-threatening if it blocks the air passages. This information is not intended to replace advice given to you by your health care provider. Make sure you discuss any questions you have with your health care provider. Document Revised:  07/23/2020 Document Reviewed: 07/23/2020 Elsevier Patient Education  2024 [employer].

## 2023-11-23 ENCOUNTER — Other Ambulatory Visit: Payer: Self-pay

## 2023-11-23 ENCOUNTER — Other Ambulatory Visit (HOSPITAL_BASED_OUTPATIENT_CLINIC_OR_DEPARTMENT_OTHER): Payer: Self-pay

## 2023-11-23 ENCOUNTER — Ambulatory Visit: Payer: Self-pay

## 2023-11-23 ENCOUNTER — Ambulatory Visit (INDEPENDENT_AMBULATORY_CARE_PROVIDER_SITE_OTHER): Admitting: Medical

## 2023-11-23 ENCOUNTER — Encounter: Payer: Self-pay | Admitting: Medical

## 2023-11-23 ENCOUNTER — Other Ambulatory Visit (HOSPITAL_COMMUNITY): Payer: Self-pay

## 2023-11-23 VITALS — BP 146/90 | HR 74 | Ht 69.0 in | Wt 265.0 lb

## 2023-11-23 DIAGNOSIS — K149 Disease of tongue, unspecified: Secondary | ICD-10-CM

## 2023-11-23 DIAGNOSIS — B370 Candidal stomatitis: Secondary | ICD-10-CM

## 2023-11-23 MED ORDER — FLUCONAZOLE 150 MG PO TABS
150.0000 mg | ORAL_TABLET | ORAL | 0 refills | Status: DC
  Filled 2023-11-23 (×2): qty 2, 14d supply, fill #0

## 2023-11-23 MED ORDER — CLOTRIMAZOLE 10 MG MT TROC
10.0000 mg | Freq: Every day | OROMUCOSAL | 0 refills | Status: AC
  Filled 2023-11-23 (×2): qty 70, 14d supply, fill #0

## 2023-11-23 NOTE — Telephone Encounter (Signed)
 FYI Only or Action Required?: FYI only for provider.  Patient was last seen in primary care on 11/21/2023 by Early, Camie BRAVO, NP.  Called Nurse Triage reporting tongue white.  Symptoms began yesterday.  Interventions attempted: Nothing.  Symptoms are: unchanged.  Triage Disposition: See HCP Within 4 Hours (Or PCP Triage)  Patient/caregiver understands and will follow disposition?: Yes             Copied from CRM #[id number]. Topic: Clinical - Red Word Triage >> Nov 23, 2023 12:56 PM Julie Bowers wrote: Red Word that prompted transfer to Nurse Triage: Allergic reaction to medication white tongue / taste buds bleeding Reason for Disposition  [1] Bloody crusts on lips or sores in mouth AND [2] rash anywhere else on body (back, chest, face, palms, soles)  Answer Assessment - Initial Assessment Questions 1. SYMPTOM: What's the main symptom you're concerned about? (e.g., chapped lips, dry mouth, lump, sores)     States allergic reaction to medication. 2. ONSET: When did the  bleeding  start?     Taste buds bleeding, states tongue is white 3. PAIN: Is there any pain? If Yes, ask: How bad is it? (Scale: 0-10; or none, mild, moderate, severe)     moderate 4. CAUSE: What do you think is causing the symptoms?     Reaction to medication 5. OTHER SYMPTOMS: Do you have any other symptoms? (e.g., fever, sore throat, toothache, swelling)     Sore throat 6. PREGNANCY: Is there any chance you are pregnant? When was your last menstrual period?     na  Protocols used: Mouth Symptoms-A-AH

## 2023-11-23 NOTE — Telephone Encounter (Signed)
 Noted, thank you!

## 2023-11-23 NOTE — Progress Notes (Signed)
 Subjective:  Julie Bowers is a 53 y.o. female who presents for Chief Complaint  Patient presents with   Follow-up    Tongue and gum pain     Here for tongue pain, redness and white covering.  She was seen here 2 days ago by her PCP for tongue swelling, possible angioedema.  At that time she was taken off valsartan  and meloxicam .  It was thought that meloxicam  could be the trigger.  At that time she had slight swelling of the tongue but no obvious redness or white plaque.  She also was recently changed from amlodipine  to valsartan  HCT and meloxicam  was started last week prior to the symptoms  At her visit 2 days ago she received Solu-Medrol  injection, was prescribed cetirizine , famotidine  and prednisone  for possible allergic reaction.  Today though she has new redness and whitish coloration in the tongue is painful.  She has a monogamous relationship for the past 2 years  She has a history of prediabetes.  No other aggravating or relieving factors.    No other c/o.  Past Medical History:  Diagnosis Date   Anemia    Anemia 03/03/2020   Encounter for medical examination to establish care 02/13/2021   Hypertension    Menorrhagia 09/28/2019   Right knee pain 11/23/2019   Acute on chronic.  Suspect meniscal injury with some recent exacerbation probably due to aging, weight, use.     Current Outpatient Medications on File Prior to Visit  Medication Sig Dispense Refill   cetirizine  HCl (ZYRTEC ) 5 MG/5ML SOLN Take 10 mLs (10 mg total) by mouth in the morning and at bedtime for 7 days. 118 mL 0   Cholecalciferol (VITAMIN D3) 50 MCG (2000 UT) capsule Take 2,000 Units by mouth daily.     EPINEPHrine  (EPIPEN  2-PAK) 0.3 mg/0.3 mL IJ SOAJ injection Inject 0.3 mg into the muscle as needed for anaphylaxis. Call 911 immediately. 2 each 2   famotidine  (PEPCID ) 40 MG/5ML suspension Take 2.5 mLs (20 mg total) by mouth 2 (two) times daily. 50 mL 0   ibuprofen  (ADVIL ) 200 MG tablet Take 1,000 mg  by mouth every 6 (six) hours as needed.     predniSONE  (DELTASONE ) 20 MG tablet Take 3 tablets by mouth daily for 2 days, then 2 tablets by mouth daily for 2 days, then 1 tablet by mouth daily for 3 days 13 tablet 0   tirzepatide  (ZEPBOUND ) 2.5 MG/0.5ML Pen Inject 2.5 mg into the skin once a week. 2 mL 0   No current facility-administered medications on file prior to visit.     The following portions of the patient's history were reviewed and updated as appropriate: allergies, current medications, past family history, past medical history, past social history, past surgical history and problem list.  ROS Otherwise as in subjective above  Objective: BP (!) 146/90 (BP Location: Right Arm, Patient Position: Sitting)   Pulse 74   Ht 5' 9 (1.753 m)   Wt 265 lb (120.2 kg)   SpO2 98%   BMI 39.13 kg/m     General appearance: alert, no distress, well developed, well nourished HEENT: normocephalic, sclerae anicteric, conjunctiva pink and moist,nares patent, no discharge or erythema Oral cavity: See picture above, there is a fairly large area of whitish plaque covering the tongue and there is some increased erythema of the tongue surrounding the white plaque but no swelling of the tongue Neck: supple, no lymphadenopathy, no thyromegaly, no masses    Assessment: Encounter Diagnoses  Name Primary?   Thrush Yes   Red tongue      Plan: We discussed her symptoms and exam findings.  We discussed her visit from 2 days ago for possible allergic reaction.  We discussed possible causes of thrush.  KOH prep today shows some yeast  Begin medications below for possible thrush.  She has continued off valsartan  HCT and meloxicam  since 2 days ago for suspected allergic reaction.  I will have her stop prednisone  for the time being since she already received Solu-Medrol  and has these new symptoms.  We discussed that thrush can take several days to resolve  Labs as below today given the new  finding of thrush  If any new or worse symptoms in the next few days call or recheck  Continue to hydrate well.  Use pured food or meal replacement shake through the next few days since the tongue is irritated and raw.  [name] was seen today for follow-up.  Diagnoses and all orders for this visit:  Thrush -     HIV Antibody (routine testing w rflx) -     Hepatitis C antibody -     Hepatitis B surface antigen  Red tongue -     HIV Antibody (routine testing w rflx) -     Hepatitis C antibody -     Hepatitis B surface antigen  Other orders -     clotrimazole  (MYCELEX ) 10 MG troche; Take 1 tablet (10 mg total) by mouth 5 (five) times daily. -     fluconazole  (DIFLUCAN ) 150 MG tablet; Take 1 tablet (150 mg total) by mouth once a week.    Follow up: Pending labs

## 2023-11-24 ENCOUNTER — Ambulatory Visit: Payer: Self-pay | Admitting: Medical

## 2023-11-24 DIAGNOSIS — R600 Localized edema: Secondary | ICD-10-CM | POA: Insufficient documentation

## 2023-11-24 LAB — HIV ANTIBODY (ROUTINE TESTING W REFLEX): HIV Screen 4th Generation wRfx: NONREACTIVE

## 2023-11-24 LAB — HEPATITIS B SURFACE ANTIGEN: Hepatitis B Surface Ag: NEGATIVE

## 2023-11-24 LAB — HEPATITIS C ANTIBODY: Hep C Virus Ab: NONREACTIVE

## 2023-11-24 NOTE — Progress Notes (Signed)
 HIV also negative.  Make sure she was able to get the Mycelex  lozenges as well as the Diflucan  yesterday and start these  If so, have her call report Monday to give us  an update on symptoms

## 2023-11-24 NOTE — Progress Notes (Signed)
Results seen by pt

## 2023-11-24 NOTE — Progress Notes (Signed)
 Results through MyChart

## 2023-11-25 ENCOUNTER — Other Ambulatory Visit (HOSPITAL_BASED_OUTPATIENT_CLINIC_OR_DEPARTMENT_OTHER): Payer: Self-pay

## 2023-12-20 ENCOUNTER — Ambulatory Visit: Admitting: Family Medicine

## 2024-01-11 ENCOUNTER — Other Ambulatory Visit: Payer: Self-pay | Admitting: Medical

## 2024-01-12 ENCOUNTER — Other Ambulatory Visit: Payer: Self-pay | Admitting: Nurse Practitioner

## 2024-01-12 ENCOUNTER — Other Ambulatory Visit: Payer: Self-pay | Admitting: Medical

## 2024-01-12 ENCOUNTER — Other Ambulatory Visit: Payer: Self-pay

## 2024-01-12 ENCOUNTER — Other Ambulatory Visit (HOSPITAL_BASED_OUTPATIENT_CLINIC_OR_DEPARTMENT_OTHER): Payer: Self-pay

## 2024-01-12 DIAGNOSIS — T783XXA Angioneurotic edema, initial encounter: Secondary | ICD-10-CM

## 2024-01-12 MED ORDER — FAMOTIDINE 40 MG/5ML PO SUSR
20.0000 mg | Freq: Two times a day (BID) | ORAL | 0 refills | Status: AC
  Filled 2024-01-12: qty 50, 10d supply, fill #0

## 2024-01-12 MED ORDER — CETIRIZINE HCL 5 MG/5ML PO SOLN
10.0000 mg | Freq: Two times a day (BID) | ORAL | 0 refills | Status: AC
  Filled 2024-01-12: qty 118, 6d supply, fill #0

## 2024-01-13 ENCOUNTER — Other Ambulatory Visit (HOSPITAL_BASED_OUTPATIENT_CLINIC_OR_DEPARTMENT_OTHER): Payer: Self-pay

## 2024-01-13 MED ORDER — FLUCONAZOLE 150 MG PO TABS
150.0000 mg | ORAL_TABLET | ORAL | 0 refills | Status: AC
  Filled 2024-01-13: qty 2, 14d supply, fill #0

## 2024-01-30 ENCOUNTER — Other Ambulatory Visit: Payer: Self-pay | Admitting: Medical Genetics

## 2024-01-30 DIAGNOSIS — Z006 Encounter for examination for normal comparison and control in clinical research program: Secondary | ICD-10-CM

## 2024-02-02 ENCOUNTER — Encounter: Payer: BC Managed Care – PPO | Admitting: Nurse Practitioner

## 2024-02-02 ENCOUNTER — Other Ambulatory Visit (HOSPITAL_BASED_OUTPATIENT_CLINIC_OR_DEPARTMENT_OTHER): Payer: Self-pay

## 2024-02-02 ENCOUNTER — Encounter (HOSPITAL_BASED_OUTPATIENT_CLINIC_OR_DEPARTMENT_OTHER): Payer: Self-pay

## 2024-03-08 LAB — GENECONNECT MOLECULAR SCREEN: Genetic Analysis Overall Interpretation: NEGATIVE

## 2024-05-07 ENCOUNTER — Emergency Department (HOSPITAL_COMMUNITY)

## 2024-05-07 ENCOUNTER — Emergency Department (HOSPITAL_COMMUNITY)
Admission: EM | Admit: 2024-05-07 | Discharge: 2024-05-08 | Disposition: A | Discharge status: Home / Self Care | Source: Home / Self Care | Attending: Emergency Medicine | Admitting: Emergency Medicine

## 2024-05-07 ENCOUNTER — Other Ambulatory Visit: Payer: Self-pay

## 2024-05-07 DIAGNOSIS — S82841A Displaced bimalleolar fracture of right lower leg, initial encounter for closed fracture: Secondary | ICD-10-CM

## 2024-05-07 DIAGNOSIS — W19XXXA Unspecified fall, initial encounter: Secondary | ICD-10-CM

## 2024-05-07 MED ORDER — OXYCODONE-ACETAMINOPHEN 5-325 MG PO TABS
1.0000 | ORAL_TABLET | Freq: Once | ORAL | Status: AC
  Administered 2024-05-07: 1 via ORAL
  Filled 2024-05-07: qty 1

## 2024-05-07 MED ORDER — OXYCODONE-ACETAMINOPHEN 5-325 MG PO TABS
1.0000 | ORAL_TABLET | Freq: Four times a day (QID) | ORAL | 0 refills | Status: AC | PRN
  Filled 2024-05-07: qty 15, 4d supply, fill #0

## 2024-05-07 MED ORDER — OXYCODONE-ACETAMINOPHEN 5-325 MG PO TABS
1.0000 | ORAL_TABLET | Freq: Four times a day (QID) | ORAL | 0 refills | Status: DC | PRN
  Filled 2024-05-07: qty 15, 4d supply, fill #0

## 2024-05-07 MED ORDER — PROPOFOL 10 MG/ML IV BOLUS
60.0000 mg | Freq: Once | INTRAVENOUS | Status: AC
  Administered 2024-05-07: 60 mg via INTRAVENOUS
  Filled 2024-05-07: qty 20

## 2024-05-07 MED ORDER — LACTATED RINGERS IV BOLUS
1000.0000 mL | Freq: Once | INTRAVENOUS | Status: AC
  Administered 2024-05-07: 1000 mL via INTRAVENOUS

## 2024-05-07 MED ORDER — ONDANSETRON HCL 4 MG/2ML IJ SOLN
4.0000 mg | Freq: Once | INTRAMUSCULAR | Status: AC
  Administered 2024-05-07: 4 mg via INTRAVENOUS
  Filled 2024-05-07: qty 2

## 2024-05-07 MED ORDER — PROPOFOL 10 MG/ML IV BOLUS
40.0000 mg | Freq: Once | INTRAVENOUS | Status: AC
  Administered 2024-05-07: 40 mg via INTRAVENOUS

## 2024-05-07 NOTE — Progress Notes (Signed)
 Orthopedic Tech Progress Note Patient Details:  Julie Bowers 04-Jul-1970 [id number]  Ortho Devices Type of Ortho Device: Crutches Ortho Device/Splint Location: rle Ortho Device/Splint Interventions: Ordered, Application, Adjustment   Post Interventions Patient Tolerated: Well Instructions Provided: Care of device, Adjustment of device  Chandra Dorn PARAS 05/07/2024, 11:59 PM

## 2024-05-07 NOTE — ED Provider Notes (Incomplete)
" °  [employer] EMERGENCY DEPARTMENT AT [facility] Provider Note   CSN: [id number] Arrival date & time: 05/07/24  1744     Patient presents with: Julie Bowers is a 54 y.o. female.  {Add pertinent medical, surgical, social history, OB history to YEP:67052}  Fall       Prior to Admission medications  Medication Sig Start Date End Date Taking? Authorizing Provider  cetirizine  HCl (ZYRTEC ) 5 MG/5ML SOLN Take 10 mLs (10 mg total) by mouth in the morning and at bedtime for 7 days. 01/12/24 01/19/24  Early, Sara E, NP  Cholecalciferol (VITAMIN D3) 50 MCG (2000 UT) capsule Take 2,000 Units by mouth daily.    [provider]  clotrimazole  (MYCELEX ) 10 MG troche Take 1 tablet (10 mg total) by mouth 5 (five) times daily. 11/23/23   Tysinger, Alm RAMAN, PA-C  EPINEPHrine  (EPIPEN  2-PAK) 0.3 mg/0.3 mL IJ SOAJ injection Inject 0.3 mg into the muscle as needed for anaphylaxis. Call 911 immediately. 11/21/23   Early, Sara E, NP  famotidine  (PEPCID ) 40 MG/5ML suspension Take 2.5 mLs (20 mg total) by mouth 2 (two) times daily. 01/12/24   Early, Sara E, NP  fluconazole  (DIFLUCAN ) 150 MG tablet Take 1 tablet (150 mg total) by mouth once a week. 01/13/24   Early, Sara E, NP  ibuprofen  (ADVIL ) 200 MG tablet Take 1,000 mg by mouth every 6 (six) hours as needed.    [provider]  predniSONE  (DELTASONE ) 20 MG tablet Take 3 tablets by mouth daily for 2 days, then 2 tablets by mouth daily for 2 days, then 1 tablet by mouth daily for 3 days 11/21/23   Early, Camie BRAVO, NP    Allergies: Meloxicam     Review of Systems  Updated Vital Signs There were no vitals taken for this visit.  Physical Exam  (all labs ordered are listed, but only abnormal results are displayed) Labs Reviewed - No data to display  EKG: None  Radiology: No results found.  {Document cardiac monitor, telemetry assessment procedure when appropriate:32947} Procedures   Medications Ordered in the ED -  No data to display    {Click here for ABCD2, HEART and other calculators REFRESH Note before signing:1}                              Medical Decision Making  ***  {Document critical care time when appropriate  Document review of labs and clinical decision tools ie CHADS2VASC2, etc  Document your independent review of radiology images and any outside records  Document your discussion with family members, caretakers and with consultants  Document social determinants of health affecting pt's care  Document your decision making why or why not admission, treatments were needed:32947:::1}   Final diagnoses:  None    ED Discharge Orders     None        "

## 2024-05-07 NOTE — Sedation Documentation (Signed)
Xray at bedside for post reduction xray  

## 2024-05-07 NOTE — ED Notes (Signed)
Pt calling ride to transport home.  ? ?

## 2024-05-08 ENCOUNTER — Other Ambulatory Visit (HOSPITAL_BASED_OUTPATIENT_CLINIC_OR_DEPARTMENT_OTHER): Payer: Self-pay

## 2024-05-10 ENCOUNTER — Telehealth: Payer: Self-pay | Admitting: Radiology

## 2024-05-10 NOTE — Telephone Encounter (Signed)
 Pt called wanting to make an apt. Call back number is [phone].

## 2024-05-11 ENCOUNTER — Encounter (HOSPITAL_BASED_OUTPATIENT_CLINIC_OR_DEPARTMENT_OTHER): Payer: Self-pay | Admitting: Orthopaedic Surgery

## 2024-05-11 ENCOUNTER — Ambulatory Visit (HOSPITAL_BASED_OUTPATIENT_CLINIC_OR_DEPARTMENT_OTHER): Admitting: Student

## 2024-05-11 ENCOUNTER — Other Ambulatory Visit (HOSPITAL_BASED_OUTPATIENT_CLINIC_OR_DEPARTMENT_OTHER): Payer: Self-pay

## 2024-05-11 DIAGNOSIS — S82841A Displaced bimalleolar fracture of right lower leg, initial encounter for closed fracture: Secondary | ICD-10-CM

## 2024-05-11 MED ORDER — IBUPROFEN 800 MG PO TABS
800.0000 mg | ORAL_TABLET | Freq: Three times a day (TID) | ORAL | 0 refills | Status: AC | PRN
  Filled 2024-05-11: qty 30, 10d supply, fill #0

## 2024-05-11 MED ORDER — ASPIRIN 325 MG PO TBEC
325.0000 mg | DELAYED_RELEASE_TABLET | Freq: Every day | ORAL | 0 refills | Status: AC
  Filled 2024-05-11: qty 14, 14d supply, fill #0

## 2024-05-11 MED ORDER — ACETAMINOPHEN 500 MG PO TABS
500.0000 mg | ORAL_TABLET | Freq: Four times a day (QID) | ORAL | 0 refills | Status: AC | PRN
  Filled 2024-05-11: qty 30, 8d supply, fill #0

## 2024-05-11 MED ORDER — OXYCODONE HCL 5 MG PO TABS
5.0000 mg | ORAL_TABLET | ORAL | 0 refills | Status: AC | PRN
  Filled 2024-05-11: qty 30, 5d supply, fill #0

## 2024-05-11 NOTE — Addendum Note (Signed)
 Addended by: HATCH, Pius Byrom M on: 05/11/2024 11:48 AM   Modules accepted: Orders

## 2024-05-11 NOTE — Progress Notes (Signed)
 "                                Chief Complaint: Right ankle fracture     History of Present Illness:    Julie Bowers is a 54 y.o. female who presents today for follow-up of her right ankle injury.  4 days ago, she slipped on the ice and states that her right ankle got caught behind her.  She felt a popping sensation and states that her ankle was facing the wrong direction.  She was seen in the emergency department and underwent closed reduction for an ankle fracture dislocation.  She has been placed in a splint and is taking Percocet and ibuprofen  for pain.  Surgical History:   Right foot calcaneal and navicular fixation  PMH/PSH/Family History/Social History/Meds/Allergies:    Past Medical History:  Diagnosis Date   Anemia    Anemia 03/03/2020   Encounter for medical examination to establish care 02/13/2021   Hypertension    Menorrhagia 09/28/2019   Right knee pain 11/23/2019   Acute on chronic.  Suspect meniscal injury with some recent exacerbation probably due to aging, weight, use.     Past Surgical History:  Procedure Laterality Date   FRACTURE SURGERY     Social History   Socioeconomic History   Marital status: Divorced    Spouse name: Not on file   Number of children: Not on file   Years of education: Not on file   Highest education level: Not on file  Occupational History   Not on file  Tobacco Use   Smoking status: Former    Current packs/day: 0.00    Types: Cigarettes    Quit date: 2015    Years since quitting: 11.1   Smokeless tobacco: Never   Tobacco comments:    1 pack per week   Vaping Use   Vaping status: Never Used  Substance and Sexual Activity   Alcohol use: Yes    Comment: occasional    Drug use: No   Sexual activity: Yes    Birth control/protection: None  Other Topics Concern   Not on file  Social History Narrative   Not on file   Social Drivers of Health   Tobacco Use: Medium Risk (11/23/2023)   Patient History    Smoking  Tobacco Use: Former    Smokeless Tobacco Use: Never    Passive Exposure: Not on Actuary Strain: Low Risk (01/31/2023)   Overall Financial Resource Strain (CARDIA)    Difficulty of Paying Living Expenses: Not hard at all  Food Insecurity: No Food Insecurity (01/31/2023)   Hunger Vital Sign    Worried About Running Out of Food in the Last Year: Never true    Ran Out of Food in the Last Year: Never true  Transportation Needs: No Transportation Needs (01/31/2023)   PRAPARE - Administrator, Civil Service (Medical): No    Lack of Transportation (Non-Medical): No  Physical Activity: Unknown (01/31/2023)   Exercise Vital Sign    Days of Exercise per Week: 5 days    Minutes of Exercise per Session: Patient unable to answer  Stress: No Stress Concern Present (01/31/2023)   [employer] of Occupational Health - Occupational Stress Questionnaire    Feeling of Stress : Only a little  Social Connections: Moderately Integrated (01/31/2023)   Social Connection and Isolation Panel    Frequency of Communication  with Friends and Family: More than three times a week    Frequency of Social Gatherings with Friends and Family: Twice a week    Attends Religious Services: 1 to 4 times per year    Active Member of [employer] or Organizations: No    Attends Banker Meetings: Never    Marital Status: Living with partner  Depression (PHQ2-9): Low Risk (04/30/2024)   Depression (PHQ2-9)    PHQ-2 Score: 0  Alcohol Screen: Not on file  Housing: Low Risk (01/31/2023)   Housing    Last Housing Risk Score: 0  Utilities: Not At Risk (01/31/2023)   AHC Utilities    Threatened with loss of utilities: No  Health Literacy: Not on file   Family History  Problem Relation Age of Onset   Anemia Mother    Stomach cancer Mother    Heart disease Father    Anemia Sister    Stomach cancer Brother    Allergies[1] Current Outpatient Medications  Medication Sig Dispense Refill    acetaminophen  (TYLENOL ) 500 MG tablet Take 1 tablet (500 mg total) by mouth every 6 (six) hours as needed for up to 14 days. 30 tablet 0   aspirin  EC 325 MG tablet Take 1 tablet (325 mg total) by mouth daily for 14 days. 14 tablet 0   ibuprofen  (ADVIL ) 800 MG tablet Take 1 tablet (800 mg total) by mouth every 8 (eight) hours as needed for up to 14 days. 30 tablet 0   oxyCODONE  (ROXICODONE ) 5 MG immediate release tablet Take 1 tablet (5 mg total) by mouth every 4 (four) hours as needed for severe pain (pain score 7-10) or breakthrough pain. 30 tablet 0   cetirizine  HCl (ZYRTEC ) 5 MG/5ML SOLN Take 10 mLs (10 mg total) by mouth in the morning and at bedtime for 7 days. 118 mL 0   Cholecalciferol (VITAMIN D3) 50 MCG (2000 UT) capsule Take 2,000 Units by mouth daily.     clotrimazole  (MYCELEX ) 10 MG troche Take 1 tablet (10 mg total) by mouth 5 (five) times daily. 70 Troche 0   EPINEPHrine  (EPIPEN  2-PAK) 0.3 mg/0.3 mL IJ SOAJ injection Inject 0.3 mg into the muscle as needed for anaphylaxis. Call 911 immediately. 2 each 2   famotidine  (PEPCID ) 40 MG/5ML suspension Take 2.5 mLs (20 mg total) by mouth 2 (two) times daily. 50 mL 0   fluconazole  (DIFLUCAN ) 150 MG tablet Take 1 tablet (150 mg total) by mouth once a week. 2 tablet 0   ibuprofen  (ADVIL ) 200 MG tablet Take 1,000 mg by mouth every 6 (six) hours as needed.     oxyCODONE -acetaminophen  (PERCOCET/ROXICET) 5-325 MG tablet Take 1 tablet by mouth every 6 (six) hours as needed for up to 5 days for severe pain (pain score 7-10). 15 tablet 0   predniSONE  (DELTASONE ) 20 MG tablet Take 3 tablets by mouth daily for 2 days, then 2 tablets by mouth daily for 2 days, then 1 tablet by mouth daily for 3 days 13 tablet 0   No current facility-administered medications for this visit.   No results found.  Review of Systems:   A ROS was performed including pertinent positives and negatives as documented in the HPI.  Physical Exam :   Constitutional: NAD and  appears stated age Neurological: Alert and oriented Psych: Appropriate affect and cooperative There were no vitals taken for this visit.   Comprehensive Musculoskeletal Exam:    Patient has right ankle immobilized in a posterior splint.  Brisk capillary refill and intact sensation to all 5 digits.  Able to wiggle all 5 toes.  No pinpoint tenderness of the proximal fibula.  Imaging:   Xray reviewed from ED on 05/07/2024 (right ankle 2 views): Mildly displaced distal fibular and posterior malleolus fractures with medial joint space widening   I personally reviewed and interpreted the radiographs.   Assessment:   54 y.o. female with recent right ankle fracture dislocation due to fall on the ice.  She was seen in the ED for closed reduction with improved alignment.  X-rays do show evidence of a mildly displaced bimalleolar fracture with widening of the medial joint space.  Discussed that unfortunately this is unstable and will require fixation in order to heal properly and reintroduce stability.  Discussed the fixation procedure and approximate recovery time frames. After a detailed discussion covering diagnosis and treatment options--including the risks, benefits, alternatives, and potential complications of surgical and nonsurgical management--the patient elected to proceed with surgery.  She was given a cam boot to bring to surgery and her postop meds were sent to the pharmacy.  Will plan to see her 2 weeks after surgery for postop visit.  All questions addressed at this time.  Plan :    - Plan for right ankle ORIF - Postop meds sent to pharmacy and CAM boot provided     I personally saw and evaluated the patient, and participated in the management and treatment plan.  Leonce Reveal, PA-C Orthopedics     [1]  Allergies Allergen Reactions   Meloxicam  Swelling    Sore throat and tongue with mild swelling of the tongue.    "

## 2024-05-14 ENCOUNTER — Encounter (HOSPITAL_BASED_OUTPATIENT_CLINIC_OR_DEPARTMENT_OTHER): Admission: RE | Payer: Self-pay | Source: Home / Self Care

## 2024-05-14 ENCOUNTER — Ambulatory Visit (HOSPITAL_BASED_OUTPATIENT_CLINIC_OR_DEPARTMENT_OTHER): Admission: RE | Admit: 2024-05-14 | Source: Home / Self Care | Admitting: Orthopaedic Surgery

## 2024-05-14 DIAGNOSIS — Z01818 Encounter for other preprocedural examination: Secondary | ICD-10-CM

## 2024-05-30 ENCOUNTER — Encounter (HOSPITAL_BASED_OUTPATIENT_CLINIC_OR_DEPARTMENT_OTHER): Admitting: Student
